# Patient Record
Sex: Female | Born: 1980 | Race: Black or African American | Hispanic: No | Marital: Single | Smoking: Current every day smoker
Health system: Southern US, Community
[De-identification: ages and names within clinical notes are randomized; demographics above are authoritative.]

## PROBLEM LIST (undated history)

## (undated) HISTORY — PX: HAND SURGERY: SHX662

---
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
## 2004-03-05 ENCOUNTER — Inpatient Hospital Stay (HOSPITAL_COMMUNITY): Admission: AD | Admit: 2004-03-05 | Discharge: 2004-03-05 | Payer: Self-pay | Admitting: Obstetrics and Gynecology

## 2004-03-05 ENCOUNTER — Emergency Department (HOSPITAL_COMMUNITY): Admission: EM | Admit: 2004-03-05 | Discharge: 2004-03-05 | Payer: Self-pay | Admitting: Family Medicine

## 2005-05-20 ENCOUNTER — Emergency Department (HOSPITAL_COMMUNITY): Admission: EM | Admit: 2005-05-20 | Discharge: 2005-05-20 | Payer: Self-pay | Admitting: Emergency Medicine

## 2005-05-30 ENCOUNTER — Emergency Department (HOSPITAL_COMMUNITY): Admission: EM | Admit: 2005-05-30 | Discharge: 2005-05-30 | Payer: Self-pay | Admitting: *Deleted

## 2005-07-09 ENCOUNTER — Emergency Department (HOSPITAL_COMMUNITY): Admission: EM | Admit: 2005-07-09 | Discharge: 2005-07-09 | Payer: Self-pay | Admitting: Family Medicine

## 2005-10-25 ENCOUNTER — Emergency Department (HOSPITAL_COMMUNITY): Admission: EM | Admit: 2005-10-25 | Discharge: 2005-10-25 | Payer: Self-pay | Admitting: Emergency Medicine

## 2006-07-03 ENCOUNTER — Emergency Department (HOSPITAL_COMMUNITY): Admission: EM | Admit: 2006-07-03 | Discharge: 2006-07-03 | Payer: Self-pay | Admitting: Emergency Medicine

## 2006-07-04 ENCOUNTER — Emergency Department (HOSPITAL_COMMUNITY): Admission: EM | Admit: 2006-07-04 | Discharge: 2006-07-04 | Payer: Self-pay | Admitting: Emergency Medicine

## 2008-02-01 ENCOUNTER — Emergency Department (HOSPITAL_COMMUNITY): Admission: EM | Admit: 2008-02-01 | Discharge: 2008-02-01 | Payer: Self-pay | Admitting: Emergency Medicine

## 2008-02-05 ENCOUNTER — Emergency Department (HOSPITAL_COMMUNITY): Admission: EM | Admit: 2008-02-05 | Discharge: 2008-02-05 | Payer: Self-pay | Admitting: Emergency Medicine

## 2008-03-28 ENCOUNTER — Emergency Department (HOSPITAL_COMMUNITY): Admission: EM | Admit: 2008-03-28 | Discharge: 2008-03-28 | Payer: Self-pay | Admitting: Emergency Medicine

## 2008-05-01 ENCOUNTER — Emergency Department (HOSPITAL_COMMUNITY): Admission: EM | Admit: 2008-05-01 | Discharge: 2008-05-01 | Payer: Self-pay | Admitting: Emergency Medicine

## 2008-07-29 ENCOUNTER — Emergency Department (HOSPITAL_COMMUNITY): Admission: EM | Admit: 2008-07-29 | Discharge: 2008-07-29 | Payer: Self-pay | Admitting: Emergency Medicine

## 2009-08-30 ENCOUNTER — Emergency Department (HOSPITAL_COMMUNITY): Admission: EM | Admit: 2009-08-30 | Discharge: 2009-08-30 | Payer: Self-pay | Admitting: Emergency Medicine

## 2009-09-03 ENCOUNTER — Emergency Department (HOSPITAL_COMMUNITY): Admission: EM | Admit: 2009-09-03 | Discharge: 2009-09-03 | Payer: Self-pay | Admitting: Emergency Medicine
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
## 2011-07-25 ENCOUNTER — Encounter: Payer: Self-pay | Admitting: Emergency Medicine

## 2011-07-25 ENCOUNTER — Emergency Department (HOSPITAL_COMMUNITY)
Admission: EM | Admit: 2011-07-25 | Discharge: 2011-07-25 | Discharge status: Against Medical Advice | Payer: Self-pay | Attending: Emergency Medicine | Admitting: Emergency Medicine

## 2011-07-25 DIAGNOSIS — Z0389 Encounter for observation for other suspected diseases and conditions ruled out: Secondary | ICD-10-CM | POA: Insufficient documentation

## 2011-07-25 NOTE — ED Notes (Signed)
Pt called x1. No answer at the time.

## 2011-07-25 NOTE — ED Notes (Signed)
PT. REPORTS VAGINAL ITCHING X2 DAYS , DENIES VAGINAL DRAINAGE ,  UNRELIEVED BY OTC MONISTAT.

## 2011-08-30 ENCOUNTER — Emergency Department (HOSPITAL_COMMUNITY)
Admission: EM | Admit: 2011-08-30 | Discharge: 2011-08-30 | Disposition: A | Discharge status: Home / Self Care | Payer: Self-pay | Attending: Emergency Medicine | Admitting: Emergency Medicine

## 2011-08-30 ENCOUNTER — Encounter (HOSPITAL_COMMUNITY): Payer: Self-pay | Admitting: Emergency Medicine

## 2011-08-30 DIAGNOSIS — J029 Acute pharyngitis, unspecified: Secondary | ICD-10-CM | POA: Insufficient documentation

## 2011-08-30 DIAGNOSIS — R599 Enlarged lymph nodes, unspecified: Secondary | ICD-10-CM | POA: Insufficient documentation

## 2011-08-30 DIAGNOSIS — H9209 Otalgia, unspecified ear: Secondary | ICD-10-CM | POA: Insufficient documentation

## 2011-08-30 DIAGNOSIS — F17200 Nicotine dependence, unspecified, uncomplicated: Secondary | ICD-10-CM | POA: Insufficient documentation

## 2011-08-30 MED ORDER — PENICILLIN V POTASSIUM 500 MG PO TABS: 500.0000 mg | ORAL_TABLET | Freq: Three times a day (TID) | ORAL | Status: AC

## 2011-08-30 MED ORDER — HYDROCODONE-ACETAMINOPHEN 5-325 MG PO TABS: 1.0000 | ORAL_TABLET | Freq: Four times a day (QID) | ORAL | Status: AC | PRN

## 2011-08-30 MED ORDER — ANTIPYRINE-BENZOCAINE 5.4-1.4 % OT SOLN
3.0000 [drp] | Freq: Once | OTIC | Status: AC
  Administered 2011-08-30: 4 [drp] via OTIC
  Filled 2011-08-30: qty 10

## 2011-08-30 MED ORDER — LIDOCAINE VISCOUS 2 % MT SOLN: 20.0000 mL | OROMUCOSAL | Status: AC | PRN

## 2011-08-30 MED ORDER — KETOROLAC TROMETHAMINE 60 MG/2ML IM SOLN
60.0000 mg | Freq: Once | INTRAMUSCULAR | Status: AC
  Administered 2011-08-30: 60 mg via INTRAMUSCULAR
  Filled 2011-08-30: qty 2

## 2011-08-30 NOTE — ED Provider Notes (Signed)
History     CSN: [id number]  Arrival date & time 08/30/11  1659   First MD Initiated Contact with Patient 08/30/11 1959     8:07 PM HPI Reports left side sore throat and ear pain for 2 weeks. States swollen lump in left anterior neck. Denies fever, cough. Reports painful to swallow is able to.  Patient is a 30 y.o. female presenting with pharyngitis. The history is provided by the patient.  Sore Throat This is a new problem. Episode onset: 2 weeks ago. The problem occurs constantly. The problem has been gradually worsening. Associated symptoms include neck pain and a sore throat. Pertinent negatives include no abdominal pain, chest pain, chills, congestion, coughing, fever, headaches, nausea or vomiting. The symptoms are aggravated by swallowing. She has tried acetaminophen and NSAIDs for the symptoms. The treatment provided no relief.    Past Medical History  Diagnosis Date  . Asthma     Past Surgical History  Procedure Date  . Hand surgery     No family history on file.  History  Substance Use Topics  . Smoking status: Current Everyday Smoker -- 0.5 packs/day    Types: Cigarettes  . Smokeless tobacco: Not on file  . Alcohol Use: Yes     occasionally    OB History    Grav Para Term Preterm Abortions TAB SAB Ect Mult Living                  Review of Systems  Constitutional: Negative for fever and chills.  HENT: Positive for ear pain, sore throat, trouble swallowing and neck pain. Negative for congestion, neck stiffness, voice change and sinus pressure.   Respiratory: Negative for cough, shortness of breath and wheezing.   Cardiovascular: Negative for chest pain.  Gastrointestinal: Negative for nausea, vomiting and abdominal pain.  Neurological: Negative for headaches.  All other systems reviewed and are negative.    Allergies  Review of patient's allergies indicates no known allergies.  Home Medications   Current Outpatient Rx  Name Route Sig Dispense  Refill  . ACETAMINOPHEN 500 MG PO TABS Oral Take 500 mg by mouth every 6 (six) hours as needed. pain     . ALBUTEROL SULFATE HFA 108 (90 BASE) MCG/ACT IN AERS Inhalation Inhale 2 puffs into the lungs every 6 (six) hours as needed. Shortness of breath.     . ESTER C PO Oral Take 1 tablet by mouth daily.        BP 143/87  Pulse 74  Temp(Src) 98.7 F (37.1 C) (Oral)  Resp 16  SpO2 100%  LMP 08/08/2011  Physical Exam  Vitals reviewed. Constitutional: She is oriented to person, place, and time. Vital signs are normal. She appears well-developed and well-nourished. No distress.  HENT:  Head: Normocephalic and atraumatic. No trismus in the jaw.  Right Ear: Tympanic membrane, external ear and ear canal normal.  Left Ear: Tympanic membrane, external ear and ear canal normal.  Nose: Nose normal.  Mouth/Throat: Uvula is midline and mucous membranes are normal. Posterior oropharyngeal erythema present. No oropharyngeal exudate, posterior oropharyngeal edema or tonsillar abscesses.    Eyes: Pupils are equal, round, and reactive to light.  Neck: Neck supple.  Pulmonary/Chest: Effort normal. She has no wheezes. She has no rales. She exhibits no tenderness.  Lymphadenopathy:       Head (left side): Submandibular (tender) adenopathy present.  Neurological: She is alert and oriented to person, place, and time.  Skin: Skin is warm and dry.  No rash noted. No erythema. No pallor.  Psychiatric: She has a normal mood and affect. Her behavior is normal.    ED Course  Procedures   MDM          Thomasene Lot, [place] 08/30/11 2019

## 2011-08-30 NOTE — ED Notes (Signed)
Pt c/o throat pain and swelling and ear pain beginning again yesterday; cold s/s x 2 week; reports that appx 2 weeks ago throat was swollen and sore but went away, returning yesterday; denies difficulty breathing/swallowing at triage, no stridor present, respirations unlabored

## 2011-08-31 NOTE — ED Provider Notes (Signed)
Medical screening examination/treatment/procedure(s) were performed by non-physician practitioner and as supervising physician I was immediately available for consultation/collaboration.   Glorianne Proctor, MD 08/31/11 0103 

## 2012-09-11 ENCOUNTER — Encounter (HOSPITAL_COMMUNITY): Payer: Self-pay

## 2012-09-11 ENCOUNTER — Emergency Department (HOSPITAL_COMMUNITY)
Admission: EM | Admit: 2012-09-11 | Discharge: 2012-09-11 | Disposition: A | Discharge status: Home / Self Care | Payer: Self-pay | Attending: Emergency Medicine | Admitting: Emergency Medicine

## 2012-09-11 DIAGNOSIS — F17200 Nicotine dependence, unspecified, uncomplicated: Secondary | ICD-10-CM | POA: Insufficient documentation

## 2012-09-11 DIAGNOSIS — R21 Rash and other nonspecific skin eruption: Secondary | ICD-10-CM | POA: Insufficient documentation

## 2012-09-11 DIAGNOSIS — B86 Scabies: Secondary | ICD-10-CM | POA: Insufficient documentation

## 2012-09-11 DIAGNOSIS — Z79899 Other long term (current) drug therapy: Secondary | ICD-10-CM | POA: Insufficient documentation

## 2012-09-11 DIAGNOSIS — J45909 Unspecified asthma, uncomplicated: Secondary | ICD-10-CM | POA: Insufficient documentation

## 2012-09-11 DIAGNOSIS — L089 Local infection of the skin and subcutaneous tissue, unspecified: Secondary | ICD-10-CM | POA: Insufficient documentation

## 2012-09-11 MED ORDER — PERMETHRIN 5 % EX CREA: TOPICAL_CREAM | CUTANEOUS | Status: DC

## 2012-09-11 MED ORDER — SULFAMETHOXAZOLE-TRIMETHOPRIM 800-160 MG PO TABS: 1.0000 | ORAL_TABLET | Freq: Two times a day (BID) | ORAL | Status: DC

## 2012-09-11 NOTE — ED Provider Notes (Signed)
History     CSN: [id number]  Arrival date & time 09/11/12  1015   First MD Initiated Contact with Patient 09/11/12 1034      Chief Complaint  Patient presents with  . Pruritis  . Rash    (Consider location/radiation/quality/duration/timing/severity/associated sxs/prior treatment) HPI Comments: Patient is a 32 year old female who presents with a 2 week history of rash. The rash started gradually and progressively worsened since the onset. The rash is located on her entire body. Patient has tried nothing without relief. Patient denies new exposures to medications, soaps, lotions, detergent. Patient reports associated itching. Patient's sister and her sister's boyfriend have been recently diagnosed with scabies and she has been around them recently. No aggravating/alleviating factors. Patient denies fever, chills, NVD, sore throat, oral lesions, ocular involvement, throat closing, wheezing, SOB, chest pain, abdominal pain.      Past Medical History  Diagnosis Date  . Asthma     Past Surgical History  Procedure Date  . Hand surgery     No family history on file.  History  Substance Use Topics  . Smoking status: Current Every Day Smoker -- 0.5 packs/day    Types: Cigarettes  . Smokeless tobacco: Not on file  . Alcohol Use: Yes     Comment: occasionally    OB History    Grav Para Term Preterm Abortions TAB SAB Ect Mult Living                  Review of Systems  Skin: Positive for rash.  All other systems reviewed and are negative.    Allergies  Review of patient's allergies indicates no known allergies.  Home Medications   Current Outpatient Rx  Name  Route  Sig  Dispense  Refill  . ALBUTEROL SULFATE HFA 108 (90 BASE) MCG/ACT IN AERS   Inhalation   Inhale 2 puffs into the lungs every 6 (six) hours as needed. For shortness of breath           BP 138/88  Pulse 101  Temp 98.6 F (37 C) (Oral)  SpO2 100%  Physical Exam  Nursing note and vitals  reviewed. Constitutional: She appears well-developed and well-nourished. No distress.  HENT:  Head: Normocephalic and atraumatic.  Eyes: Conjunctivae normal are normal.  Cardiovascular: Normal rate and regular rhythm.  Exam reveals no gallop and no friction rub.   No murmur heard. Pulmonary/Chest: Effort normal and breath sounds normal. She has no wheezes. She has no rales. She exhibits no tenderness.  Abdominal: Soft. There is no tenderness.  Musculoskeletal: Normal range of motion.  Neurological: She is alert.       Speech is goal-oriented. Moves limbs without ataxia.   Skin: Skin is warm and dry.       Small scattered pustules located on bilateral wrists, ankles, torso and back, as well as buttocks.   Psychiatric: She has a normal mood and affect. Her behavior is normal.    ED Course  Procedures (including critical care time)  Labs Reviewed - No data to display No results found.   1. Scabies   2. Skin infection       MDM  11:01 AM Patient will have Permethrin for scabies. Patient also has a skin infection so I will prescribe Bactrim as well. Vitals stable for discharge. No further evaluation needed at this time.        Emilia Beck, PA-C 09/11/12 1537

## 2012-09-11 NOTE — ED Notes (Signed)
Pt has pruritis to bilateral knees, elbows, wrists, stomach, lower back, and mid chest. A&Ox4, ambulatory, nad.

## 2012-09-11 NOTE — ED Notes (Signed)
Onset 2 weeks ago developed itching took OTC medication no relief and states sister's boyfriend and sister tested for scabies. Patient states rash boils knees elbows thighs and back.

## 2012-09-11 NOTE — ED Notes (Signed)
Triage completed by Greg RN  

## 2012-09-11 NOTE — ED Provider Notes (Signed)
Medical screening examination/treatment/procedure(s) were performed by non-physician practitioner and as supervising physician I was immediately available for consultation/collaboration.   David H Yao, MD 09/11/12 1545 

## 2013-06-16 ENCOUNTER — Encounter (HOSPITAL_COMMUNITY): Payer: Self-pay | Admitting: Emergency Medicine

## 2013-06-16 ENCOUNTER — Emergency Department (HOSPITAL_COMMUNITY)
Admission: EM | Admit: 2013-06-16 | Discharge: 2013-06-16 | Discharge status: Against Medical Advice | Payer: Self-pay | Attending: Emergency Medicine | Admitting: Emergency Medicine

## 2013-06-16 DIAGNOSIS — R300 Dysuria: Secondary | ICD-10-CM | POA: Insufficient documentation

## 2013-06-16 NOTE — ED Notes (Signed)
Pt called x1 for room w/no answer

## 2013-06-16 NOTE — ED Notes (Signed)
Pt reporting dysuria and back pain for 1 week. Denies hematuria. Reports vaginal discharge, yellow with some odor. Denies fevers or chills. Pt is a x 4.

## 2013-06-16 NOTE — ED Notes (Signed)
Pt called x2 for room placement w/no answer

## 2013-06-16 NOTE — ED Notes (Signed)
Pt did not answer x 1 

## 2014-12-26 ENCOUNTER — Encounter (HOSPITAL_COMMUNITY): Payer: Self-pay | Admitting: Nurse Practitioner

## 2014-12-26 ENCOUNTER — Emergency Department (HOSPITAL_COMMUNITY): Payer: Self-pay

## 2014-12-26 ENCOUNTER — Emergency Department (HOSPITAL_COMMUNITY)
Admission: EM | Admit: 2014-12-26 | Discharge: 2014-12-26 | Disposition: A | Discharge status: Home / Self Care | Payer: Self-pay | Attending: Emergency Medicine | Admitting: Emergency Medicine

## 2014-12-26 DIAGNOSIS — S0990XA Unspecified injury of head, initial encounter: Secondary | ICD-10-CM | POA: Insufficient documentation

## 2014-12-26 DIAGNOSIS — Z720 Tobacco use: Secondary | ICD-10-CM | POA: Insufficient documentation

## 2014-12-26 DIAGNOSIS — S01511A Laceration without foreign body of lip, initial encounter: Secondary | ICD-10-CM | POA: Insufficient documentation

## 2014-12-26 DIAGNOSIS — Z23 Encounter for immunization: Secondary | ICD-10-CM | POA: Insufficient documentation

## 2014-12-26 DIAGNOSIS — Y9289 Other specified places as the place of occurrence of the external cause: Secondary | ICD-10-CM | POA: Insufficient documentation

## 2014-12-26 DIAGNOSIS — Z79899 Other long term (current) drug therapy: Secondary | ICD-10-CM | POA: Insufficient documentation

## 2014-12-26 DIAGNOSIS — S00432A Contusion of left ear, initial encounter: Secondary | ICD-10-CM

## 2014-12-26 DIAGNOSIS — J45909 Unspecified asthma, uncomplicated: Secondary | ICD-10-CM | POA: Insufficient documentation

## 2014-12-26 DIAGNOSIS — Y998 Other external cause status: Secondary | ICD-10-CM | POA: Insufficient documentation

## 2014-12-26 DIAGNOSIS — Y9389 Activity, other specified: Secondary | ICD-10-CM | POA: Insufficient documentation

## 2014-12-26 MED ORDER — NAPROXEN 500 MG PO TABS: 500.0000 mg | ORAL_TABLET | Freq: Two times a day (BID) | ORAL | Status: DC

## 2014-12-26 MED ORDER — TETANUS-DIPHTH-ACELL PERTUSSIS 5-2.5-18.5 LF-MCG/0.5 IM SUSP
0.5000 mL | Freq: Once | INTRAMUSCULAR | Status: AC
  Administered 2014-12-26: 0.5 mL via INTRAMUSCULAR
  Filled 2014-12-26: qty 0.5

## 2014-12-26 NOTE — Discharge Instructions (Signed)
Ice your lip and your ear several times a day. If continue to have problems with hearing, follow up with ENT specialist as referred.   Head Injury You have received a head injury. It does not appear serious at this time. Headaches and vomiting are common following head injury. It should be easy to awaken from sleeping. Sometimes it is necessary for you to stay in the emergency department for a while for observation. Sometimes admission to the hospital may be needed. After injuries such as yours, most problems occur within the first 24 hours, but side effects may occur up to 7-10 days after the injury. It is important for you to carefully monitor your condition and contact your health care provider or seek immediate medical care if there is a change in your condition. WHAT ARE THE TYPES OF HEAD INJURIES? Head injuries can be as minor as a bump. Some head injuries can be more severe. More severe head injuries include:  A jarring injury to the brain (concussion).  A bruise of the brain (contusion). This mean there is bleeding in the brain that can cause swelling.  A cracked skull (skull fracture).  Bleeding in the brain that collects, clots, and forms a bump (hematoma). WHAT CAUSES A HEAD INJURY? A serious head injury is most likely to happen to someone who is in a car wreck and is not wearing a seat belt. Other causes of major head injuries include bicycle or motorcycle accidents, sports injuries, and falls. HOW ARE HEAD INJURIES DIAGNOSED? A complete history of the event leading to the injury and your current symptoms will be helpful in diagnosing head injuries. Many times, pictures of the brain, such as CT or MRI are needed to see the extent of the injury. Often, an overnight hospital stay is necessary for observation.  WHEN SHOULD I SEEK IMMEDIATE MEDICAL CARE?  You should get help right away if:  You have confusion or drowsiness.  You feel sick to your stomach (nauseous) or have continued,  forceful vomiting.  You have dizziness or unsteadiness that is getting worse.  You have severe, continued headaches not relieved by medicine. Only take over-the-counter or prescription medicines for pain, fever, or discomfort as directed by your health care provider.  You do not have normal function of the arms or legs or are unable to walk.  You notice changes in the black spots in the center of the colored part of your eye (pupil).  You have a clear or bloody fluid coming from your nose or ears.  You have a loss of vision. During the next 24 hours after the injury, you must stay with someone who can watch you for the warning signs. This person should contact local emergency services (911 in the [place] if you have seizures, you become unconscious, or you are unable to wake up. HOW CAN I PREVENT A HEAD INJURY IN THE FUTURE? The most important factor for preventing major head injuries is avoiding motor vehicle accidents. To minimize the potential for damage to your head, it is crucial to wear seat belts while riding in motor vehicles. Wearing helmets while bike riding and playing collision sports (like football) is also helpful. Also, avoiding dangerous activities around the house will further help reduce your risk of head injury.  WHEN CAN I RETURN TO NORMAL ACTIVITIES AND ATHLETICS? You should be reevaluated by your health care provider before returning to these activities. If you have any of the following symptoms, you should not return to activities  or contact sports until 1 week after the symptoms have stopped:  Persistent headache.  Dizziness or vertigo.  Poor attention and concentration.  Confusion.  Memory problems.  Nausea or vomiting.  Fatigue or tire easily.  Irritability.  Intolerant of bright lights or loud noises.  Anxiety or depression.  Disturbed sleep. MAKE SURE YOU:   Understand these instructions.  Will watch your condition.  Will get help right away if  you are not doing well or get worse. Document Released: 08/20/2005 Document Revised: 08/25/2013 Document Reviewed: 04/27/2013 [facility] Patient Information 2015 [place], [place]. This information is not intended to replace advice given to you by your health care provider. Make sure you discuss any questions you have with your health care provider.

## 2014-12-26 NOTE — ED Notes (Signed)
Patient transported to CT 
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
## 2014-12-26 NOTE — ED Notes (Signed)
Pt standing at nurses station wanting to leave, pt given D/C instructions and left
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
## 2018-06-09 ENCOUNTER — Other Ambulatory Visit: Payer: Self-pay

## 2018-06-09 ENCOUNTER — Emergency Department (HOSPITAL_COMMUNITY)
Admission: EM | Admit: 2018-06-09 | Discharge: 2018-06-09 | Disposition: A | Discharge status: Home / Self Care | Payer: Self-pay | Attending: Emergency Medicine | Admitting: Emergency Medicine

## 2018-06-09 ENCOUNTER — Encounter (HOSPITAL_COMMUNITY): Payer: Self-pay | Admitting: Emergency Medicine

## 2018-06-09 DIAGNOSIS — H9203 Otalgia, bilateral: Secondary | ICD-10-CM | POA: Insufficient documentation

## 2018-06-09 DIAGNOSIS — J029 Acute pharyngitis, unspecified: Secondary | ICD-10-CM | POA: Insufficient documentation

## 2018-06-09 DIAGNOSIS — J45909 Unspecified asthma, uncomplicated: Secondary | ICD-10-CM | POA: Insufficient documentation

## 2018-06-09 DIAGNOSIS — Z79899 Other long term (current) drug therapy: Secondary | ICD-10-CM | POA: Insufficient documentation

## 2018-06-09 DIAGNOSIS — F17210 Nicotine dependence, cigarettes, uncomplicated: Secondary | ICD-10-CM | POA: Insufficient documentation

## 2018-06-09 MED ORDER — AZITHROMYCIN 250 MG PO TABS: 250.0000 mg | ORAL_TABLET | Freq: Once | ORAL | 0 refills | Status: AC

## 2018-06-09 MED ORDER — NEOMYCIN-POLYMYXIN-HC 3.5-10000-1 OT SUSP: 4.0000 [drp] | Freq: Three times a day (TID) | OTIC | 0 refills | Status: AC

## 2018-06-09 NOTE — Discharge Instructions (Signed)
You were evaluated in the emergency department for earache and sore throat.  We are prescribing some antibiotics and some eardrops.  Please continue warm salt water gargles and Tylenol and ibuprofen for pain.  Keep well-hydrated.  Return if any worsening symptoms.

## 2018-06-09 NOTE — ED Triage Notes (Signed)
States history of "swimmers ear" every year and developed bilateral ear pain 2 days ago with a sore throat. Airway intact bilateral equal chest rise and fall.

## 2018-06-09 NOTE — ED Notes (Signed)
Signature pad not working verbalized understanding of discharge instructions.  

## 2018-06-09 NOTE — ED Provider Notes (Signed)
[facility] EMERGENCY DEPARTMENT Provider Note   CSN: [id number] Arrival date & time: 06/09/18  0865     History   Chief Complaint Chief Complaint  Patient presents with  . Ear Fullness  . Sore Throat    HPI Ashley Barber is a 37 y.o. female.  She is complaining of a few days of bilateral ear pain and throat pain.  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  She has a history of getting swimmer's ear in the past although she does not particularly swim much.  No fevers no chills.  Says it hurts more when she swallows.  She is tried nothing for it.  She is a smoker.   Ear Fullness  This is a new problem. The current episode started 2 days ago. The problem occurs constantly. The problem has not changed since onset.Pertinent negatives include no chest pain, no abdominal pain, no headaches and no shortness of breath. The symptoms are aggravated by swallowing. Nothing relieves the symptoms. She has tried nothing for the symptoms. The treatment provided no relief.  Sore Throat  The current episode started 2 days ago. The problem occurs constantly. The problem has not changed since onset.Pertinent negatives include no chest pain, no abdominal pain, no headaches and no shortness of breath. The symptoms are aggravated by swallowing. Nothing relieves the symptoms. She has tried nothing for the symptoms. The treatment provided no relief.    Past Medical History:  Diagnosis Date  . Asthma     There are no active problems to display for this patient.   Past Surgical History:  Procedure Laterality Date  . HAND SURGERY       OB History   None      Home Medications    Prior to Admission medications   Medication Sig Start Date End Date Taking? Authorizing Provider  naproxen (NAPROSYN) 500 MG tablet Take 1 tablet (500 mg total) by mouth 2 (two) times daily. 12/26/14   Jeannett Senior, PA-C    Family History No  family history on file.  Social History Social History   Tobacco Use  . Smoking status: Current Every Day Smoker    Packs/day: 0.50    Types: Cigarettes  Substance Use Topics  . Alcohol use: Yes    Comment: occasionally  . Drug use: No     Allergies   Patient has no known allergies.   Review of Systems Review of Systems  Constitutional: Negative for fever.  HENT: Positive for ear pain and sore throat. Negative for ear discharge.   Eyes: Negative for visual disturbance.  Respiratory: Negative for shortness of breath.   Cardiovascular: Negative for chest pain.  Gastrointestinal: Negative for abdominal pain.  Genitourinary: Negative for dysuria.  Musculoskeletal: Negative for neck pain.  Skin: Negative for rash.  Neurological: Negative for headaches.     Physical Exam Updated Vital Signs BP (!) 118/97   Pulse (!) 108   Temp 98.9 F (37.2 C) (Oral)   Resp 16   Ht 5\' 3"  (1.6 m)   Wt 46.7 kg   SpO2 98%   BMI 18.25 kg/m   Physical Exam  Constitutional: She appears well-developed. No distress.  HENT:  Head: Normocephalic and atraumatic.  Mouth/Throat: Uvula is midline and oropharynx is clear and moist. No uvula swelling. No tonsillar exudate.  Patient's ears canals do not have significant amount of narrowing.  There is a little bit of debris in  there but that could just be wax.  She does have some erythema in her posterior pharynx and multiple superficial shallow ulcers over her soft palate and pharyngeal pillars.  Eyes: Conjunctivae are normal.  Neck: Neck supple.  Cardiovascular: Normal rate, regular rhythm and intact distal pulses.  No murmur heard. Pulmonary/Chest: Effort normal and breath sounds normal. No stridor. No respiratory distress. She has no wheezes.  Abdominal: Soft. There is no tenderness.  Musculoskeletal: Normal range of motion. She exhibits no edema or tenderness.  Lymphadenopathy:    She has cervical adenopathy.  Neurological: She is alert.  GCS eye subscore is 4. GCS verbal subscore is 5. GCS motor subscore is 6.  Skin: Skin is warm and dry.  Psychiatric: She has a normal mood and affect.  Nursing note and vitals reviewed.    ED Treatments / Results  Labs (all labs ordered are listed, but only abnormal results are displayed) Labs Reviewed - No data to display  EKG None  Radiology No results found.  Procedures Procedures (including critical care time)  Medications Ordered in ED Medications - No data to display   Initial Impression / Assessment and Plan / ED Course  I have reviewed the triage vital signs and the nursing notes.  Pertinent labs & imaging results that were available during my care of the patient were reviewed by me and considered in my medical decision making (see chart for details).    Patient with likely viral upper respiratory infection with a shallow ulcerations in her pharynx and her blocked ear sensation.  She states she has a history of swimmer's ear and there is some debris in the canals but it does not seem that significant.  We will try her later on some eardrops and a course of antibiotics for the pharynx.  She denies any HIV or chronic disease and no high risk factors.  Recommend that she follow-up back here if she is not improving in a few days.  Final Clinical Impressions(s) / ED Diagnoses   Final diagnoses:  Pharyngitis, unspecified etiology  Otalgia of both ears    ED Discharge Orders         Ordered    neomycin-polymyxin-hydrocortisone (CORTISPORIN) 3.5-10000-1 OTIC suspension  3 times daily     06/09/18 1016    azithromycin (ZITHROMAX Z-PAK) 250 MG tablet   Once     06/09/18 1016           Hayden Rasmussen, MD 06/10/18 1440

## 2019-05-24 ENCOUNTER — Other Ambulatory Visit: Payer: Self-pay

## 2019-05-24 ENCOUNTER — Emergency Department (HOSPITAL_COMMUNITY)
Admission: EM | Admit: 2019-05-24 | Discharge: 2019-05-24 | Disposition: A | Discharge status: Home / Self Care | Payer: Self-pay | Attending: Emergency Medicine | Admitting: Emergency Medicine

## 2019-05-24 DIAGNOSIS — L709 Acne, unspecified: Secondary | ICD-10-CM | POA: Insufficient documentation

## 2019-05-24 DIAGNOSIS — F17210 Nicotine dependence, cigarettes, uncomplicated: Secondary | ICD-10-CM | POA: Insufficient documentation

## 2019-05-24 DIAGNOSIS — R21 Rash and other nonspecific skin eruption: Secondary | ICD-10-CM | POA: Insufficient documentation

## 2019-05-24 DIAGNOSIS — J45909 Unspecified asthma, uncomplicated: Secondary | ICD-10-CM | POA: Insufficient documentation

## 2019-05-24 MED ORDER — CLINDAMYCIN PHOSPHATE 1 % EX SOLN: Freq: Two times a day (BID) | CUTANEOUS | 0 refills | Status: DC

## 2019-05-24 NOTE — ED Notes (Signed)
Patient verbalizes understanding of discharge instructions. Opportunity for questioning and answers were provided. Armband removed by staff, pt discharged from ED.  

## 2019-05-24 NOTE — ED Provider Notes (Signed)
[facility] EMERGENCY DEPARTMENT Provider Note   CSN: [id number] Arrival date & time: 05/24/19  1451     History   Chief Complaint No chief complaint on file.   HPI Ashley Barber is a 38 y.o. female presents for evaluation of acute onset, progressively worsening rash to the face for 1 week.  She reports that symptoms began with one pimple that she "popped "and since then has had several other lesions along her cheeks and nose.  Has not tried anything for symptoms.  Denies any fevers, nausea, vomiting.  No facial swelling, no drooling, throat tightness, swelling of lips or tongue.  Denies any new soaps, detergents, shampoos, or lotions.  No new exposures to any medications.  Has not tried anything for her symptoms.     The history is provided by the patient.    Past Medical History:  Diagnosis Date  . Asthma     There are no active problems to display for this patient.   Past Surgical History:  Procedure Laterality Date  . HAND SURGERY       OB History   No obstetric history on file.      Home Medications    Prior to Admission medications   Medication Sig Start Date End Date Taking? Authorizing Provider  clindamycin (CLEOCIN T) 1 % external solution Apply topically 2 (two) times daily. 05/24/19   Kelton Bultman A, PA-C  naproxen (NAPROSYN) 500 MG tablet Take 1 tablet (500 mg total) by mouth 2 (two) times daily. 12/26/14   Jeannett Senior, PA-C    Family History No family history on file.  Social History Social History   Tobacco Use  . Smoking status: Current Every Day Smoker    Packs/day: 0.50    Types: Cigarettes  Substance Use Topics  . Alcohol use: Yes    Comment: occasionally  . Drug use: No     Allergies   Patient has no known allergies.   Review of Systems Review of Systems  Constitutional: Negative for chills and fever.  HENT: Negative for facial swelling and trouble swallowing.   Respiratory: Negative for shortness of  breath.   Cardiovascular: Negative for chest pain.  Gastrointestinal: Negative for abdominal pain, nausea and vomiting.  Skin: Positive for rash.  All other systems reviewed and are negative.    Physical Exam Updated Vital Signs BP 127/80 (BP Location: Right Arm)   Pulse (!) 105   Temp 99.7 F (37.6 C) (Oral)   Resp 17   SpO2 100%   Physical Exam Vitals signs and nursing note reviewed.  Constitutional:      General: She is not in acute distress.    Appearance: She is well-developed.  HENT:     Head: Normocephalic and atraumatic.     Mouth/Throat:     Comments: No swelling of the lips or tongue.  Tolerating secretions without difficulty.  No uvular edema. Eyes:     General:        Right eye: No discharge.        Left eye: No discharge.     Conjunctiva/sclera: Conjunctivae normal.  Neck:     Vascular: No JVD.     Trachea: No tracheal deviation.  Cardiovascular:     Rate and Rhythm: Normal rate and regular rhythm.  Pulmonary:     Effort: Pulmonary effort is normal.     Breath sounds: Normal breath sounds.  Abdominal:     General: There is no distension.  Skin:  General: Skin is warm and dry.     Findings: Rash present.     Comments: See below image.  Patient with multiple raised nodular lesions to the face primarily over the cheeks and nose.  Neurological:     Mental Status: She is alert.  Psychiatric:        Behavior: Behavior normal.        ED Treatments / Results  Labs (all labs ordered are listed, but only abnormal results are displayed) Labs Reviewed - No data to display  EKG None  Radiology No results found.  Procedures Procedures (including critical care time)  Medications Ordered in ED Medications - No data to display   Initial Impression / Assessment and Plan / ED Course  I have reviewed the triage vital signs and the nursing notes.  Pertinent labs & imaging results that were available during my care of the patient were reviewed by  me and considered in my medical decision making (see chart for details).         Patient presenting with rash consistent with acne.  She is afebrile, initially mildly tachycardic on triage but is not tachycardic on my assessment.  Nontoxic in appearance.  No evidence of anaphylactic reaction or urticarial rash.  No respiratory distress.  Tolerating secretions without difficulty.  Will treat with topical clindamycin, recommend over-the-counter benzyl peroxide and salicylic acid washes.  Discussed cleaning her masks frequently.  Recommend follow-up with PCP or dermatology for reevaluation of symptoms.  Discussed strict ED return precautions. Pt verbalized understanding of and agreement with plan and is safe for discharge home at this time.   Final Clinical Impressions(s) / ED Diagnoses   Final diagnoses:  Facial rash    ED Discharge Orders         Ordered    clindamycin (CLEOCIN T) 1 % external solution  2 times daily     05/24/19 [id number], PA-C 05/24/19 1650    Tegeler, Gwenyth Allegra, MD 05/25/19 0003

## 2019-05-24 NOTE — Discharge Instructions (Addendum)
I recommend that you use a daily cleanser  You might try an over the counter cleanser that has benzoyl peroxide.  I recommend that you start with a product that has 2.5% benzoyl peroxide.  Stronger concentrations have not been shown to be more effective.  I have prescribed a topical gel with an antibiotic:  Clindamycin 1% lotion.  Apply the lotion to the affected skin twice daily.  Be sure to read the package insert to understand potential side effects,    If excessive dryness or peeling occurs, reduce dose frequency or concentration of the topical scrubs.  If excessive stinging or burning occurs, remove the topical gel with mild soap and water and resume at a lower dose the next day.  Remember oral antibiotics and topical acne treatments may increase your sensitivity to the sun!  HOME CARE: Do not squeeze pimples because that can often lead to infections, worse acne, and scars. Use a moisturizer that contains retinoid or fruit acids that may inhibit the development of new acne lesions. Although there is not a clear link that foods can cause acne, doctors do believe that too many sweets predispose you to skin problems.  GET HELP RIGHT AWAY IF: If your acne gets worse or is not better within 10 days. If you become depressed. If you become pregnant, discontinue medications and call your OB/GYN.

## 2019-05-24 NOTE — ED Triage Notes (Signed)
Pt sts she had a pimple last week that she popped and then scratched for several days. Over the last few days more blemishes have appeared that are painful and itchy.

## 2019-07-02 ENCOUNTER — Emergency Department (HOSPITAL_COMMUNITY)
Admission: EM | Admit: 2019-07-02 | Discharge: 2019-07-02 | Disposition: A | Discharge status: Home / Self Care | Payer: Self-pay | Attending: Emergency Medicine | Admitting: Emergency Medicine

## 2019-07-02 DIAGNOSIS — F17210 Nicotine dependence, cigarettes, uncomplicated: Secondary | ICD-10-CM | POA: Insufficient documentation

## 2019-07-02 DIAGNOSIS — J45909 Unspecified asthma, uncomplicated: Secondary | ICD-10-CM | POA: Insufficient documentation

## 2019-07-02 DIAGNOSIS — Z79899 Other long term (current) drug therapy: Secondary | ICD-10-CM | POA: Insufficient documentation

## 2019-07-02 DIAGNOSIS — H1032 Unspecified acute conjunctivitis, left eye: Secondary | ICD-10-CM | POA: Insufficient documentation

## 2019-07-02 MED ORDER — TETRACAINE HCL 0.5 % OP SOLN
2.0000 [drp] | Freq: Once | OPHTHALMIC | Status: AC
  Administered 2019-07-02: 2 [drp] via OPHTHALMIC
  Filled 2019-07-02: qty 4

## 2019-07-02 MED ORDER — FLUORESCEIN SODIUM 1 MG OP STRP
1.0000 | ORAL_STRIP | Freq: Once | OPHTHALMIC | Status: AC
  Administered 2019-07-02: 1 via OPHTHALMIC
  Filled 2019-07-02: qty 1

## 2019-07-02 MED ORDER — ERYTHROMYCIN 5 MG/GM OP OINT
1.0000 "application " | TOPICAL_OINTMENT | Freq: Once | OPHTHALMIC | Status: AC
  Administered 2019-07-02: 1 via OPHTHALMIC
  Filled 2019-07-02: qty 3.5

## 2019-07-02 NOTE — Discharge Instructions (Signed)
Please read instructions below. It is important you wash your hands very frequently as this is contagious and can travel to your other eye or to other people. Apply a warm compress to your right eye multiple times per day. Apply 1/2 inch ribbon of antibiotic ointment to your eye 4 times daily for 5 days. Schedule an appointment with the eye doctor to follow-up and ensure proper healing. Return to the emergency department if you develop thick yellow/greenish discharge, loss of vision, or new or worsening symptoms.

## 2019-07-02 NOTE — ED Provider Notes (Signed)
[facility]ERGENCY DEPARTMENT Provider Note   CSN: [id number] Arrival date & time: 07/02/19  1415     History   Chief Complaint Chief Complaint  Patient presents with  . Eye Pain    HPI Ashley Barber is a 38 y.o. female presenting to the emergency department with gradual onset of left eye irritation that began last night.  Patient states initially she felt near foreign body sensation and then she had progressively worsening discomfort.  Her eye is red with clear drainage, however she woke up this morning with purulent drainage in her eye was sealed shut.  She works at [employer] and has had no known foreign body in her eye.  She wears glasses however is not a contact lens wearer.  No headache.  Slightly blurry vision due to the drainage.     The history is provided by the patient.    Past Medical History:  Diagnosis Date  . Asthma     There are no active problems to display for this patient.   Past Surgical History:  Procedure Laterality Date  . HAND SURGERY       OB History   No obstetric history on file.      Home Medications    Prior to Admission medications   Medication Sig Start Date End Date Taking? Authorizing Provider  clindamycin (CLEOCIN T) 1 % external solution Apply topically 2 (two) times daily. 05/24/19   Fawze, Mina A, PA-C  naproxen (NAPROSYN) 500 MG tablet Take 1 tablet (500 mg total) by mouth 2 (two) times daily. 12/26/14   Jeannett Senior, PA-C    Family History No family history on file.  Social History Social History   Tobacco Use  . Smoking status: Current Every Day Smoker    Packs/day: 0.50    Types: Cigarettes  Substance Use Topics  . Alcohol use: Yes    Comment: occasionally  . Drug use: No     Allergies   Patient has no known allergies.   Review of Systems Review of Systems  Constitutional: Negative for fever.  HENT: Positive for ear discharge and ear pain.   Neurological: Negative for  headaches.     Physical Exam Updated Vital Signs BP (!) 139/93 (BP Location: Right Arm)   Pulse 85   Temp 98.3 F (36.8 C) (Oral)   Resp 18   LMP 06/30/2019   SpO2 100%   Physical Exam Vitals signs and nursing note reviewed.  Constitutional:      Appearance: She is well-developed.  HENT:     Head: Normocephalic and atraumatic.  Eyes:     Extraocular Movements: Extraocular movements intact.     Pupils: Pupils are equal, round, and reactive to light.     Comments: Left upper lip with slight edema.  Left eye conjunctival injection with mild chemosis present.  There is active clear discharge present.  L eye visualized under Woods lamp with fluorescein stain; no uptake noted.  No dendritic staining.  No obvious foreign bodies. No consensual photophobia.  Pulmonary:     Effort: Pulmonary effort is normal.  Neurological:     Mental Status: She is alert.  Psychiatric:        Mood and Affect: Mood normal.        Behavior: Behavior normal.      ED Treatments / Results  Labs (all labs ordered are listed, but only abnormal results are displayed) Labs Reviewed - No data to display  EKG  None  Radiology No results found.  Procedures Procedures (including critical care time)  Medications Ordered in ED Medications  fluorescein ophthalmic strip 1 strip (1 strip Left Eye Given 07/02/19 1648)  tetracaine (PONTOCAINE) 0.5 % ophthalmic solution 2 drop (2 drops Left Eye Given 07/02/19 1648)  erythromycin ophthalmic ointment 1 application (1 application Left Eye Given 07/02/19 1733)     Initial Impression / Assessment and Plan / ED Course  I have reviewed the triage vital signs and the nursing notes.  Pertinent labs & imaging results that were available during my care of the patient were reviewed by me and considered in my medical decision making (see chart for details).       DANYEAL GAMEL presents with symptoms consistent with bacterial conjunctivitis.  Purulent  discharge reported.  No corneal abrasions, entrapment, consensual photophobia, or dendritic staining with fluorescein study.  Presentation non-concerning for  corneal abrasions or HSV. No evidence of preseptal or orbital cellulitis.  Pt is  not a contact lens wearer.  Patient will be given erythromycin ophthalmic.  Personal hygiene and frequent handwashing discussed.  Patient advised to followup with ophthalmologist for reevaluation in several days..  Patient verbalizes understanding and is agreeable with discharge.  Discussed results, findings, treatment and follow up. Patient advised of return precautions. Patient verbalized understanding and agreed with plan.  Final Clinical Impressions(s) / ED Diagnoses   Final diagnoses:  Acute bacterial conjunctivitis of left eye    ED Discharge Orders    None       Darlyn Repsher, [place] N, PA-C 07/02/19 1749    Quintella Reichert, MD 07/03/19 [phone]

## 2019-07-02 NOTE — ED Triage Notes (Signed)
Patient to ER for evaluation of left eye pain, drainage, and redness. Pain with light. Draining clear drainage. She does report this morning it was mucus.

## 2019-09-22 ENCOUNTER — Encounter (HOSPITAL_COMMUNITY): Payer: Self-pay | Admitting: Emergency Medicine

## 2019-09-22 ENCOUNTER — Emergency Department (HOSPITAL_COMMUNITY)
Admission: EM | Admit: 2019-09-22 | Discharge: 2019-09-22 | Disposition: A | Discharge status: Home / Self Care | Payer: Self-pay | Attending: Emergency Medicine | Admitting: Emergency Medicine

## 2019-09-22 ENCOUNTER — Other Ambulatory Visit: Payer: Self-pay

## 2019-09-22 DIAGNOSIS — Z20822 Contact with and (suspected) exposure to covid-19: Secondary | ICD-10-CM | POA: Insufficient documentation

## 2019-09-22 DIAGNOSIS — N72 Inflammatory disease of cervix uteri: Secondary | ICD-10-CM | POA: Insufficient documentation

## 2019-09-22 DIAGNOSIS — F17210 Nicotine dependence, cigarettes, uncomplicated: Secondary | ICD-10-CM | POA: Insufficient documentation

## 2019-09-22 DIAGNOSIS — N730 Acute parametritis and pelvic cellulitis: Secondary | ICD-10-CM | POA: Insufficient documentation

## 2019-09-22 DIAGNOSIS — Z79899 Other long term (current) drug therapy: Secondary | ICD-10-CM | POA: Insufficient documentation

## 2019-09-22 DIAGNOSIS — N889 Noninflammatory disorder of cervix uteri, unspecified: Secondary | ICD-10-CM | POA: Insufficient documentation

## 2019-09-22 DIAGNOSIS — J45909 Unspecified asthma, uncomplicated: Secondary | ICD-10-CM | POA: Insufficient documentation

## 2019-09-22 LAB — COMPREHENSIVE METABOLIC PANEL
ALT: 32 U/L (ref 0–44)
AST: 61 U/L — ABNORMAL HIGH (ref 15–41)
Albumin: 1.8 g/dL — ABNORMAL LOW (ref 3.5–5.0)
Alkaline Phosphatase: 76 U/L (ref 38–126)
Anion gap: 6 (ref 5–15)
BUN: 7 mg/dL (ref 6–20)
CO2: 26 mmol/L (ref 22–32)
Calcium: 7.8 mg/dL — ABNORMAL LOW (ref 8.9–10.3)
Chloride: 101 mmol/L (ref 98–111)
Creatinine, Ser: 0.64 mg/dL (ref 0.44–1.00)
GFR calc Af Amer: >60 mL/min (ref >60)
GFR calc non Af Amer: >60 mL/min (ref >60)
Glucose, Bld: 85 mg/dL (ref 70–99)
Potassium: 3.9 mmol/L (ref 3.5–5.1)
Sodium: 133 mmol/L — ABNORMAL LOW (ref 135–145)
Total Bilirubin: 0.5 mg/dL (ref 0.3–1.2)
Total Protein: 7.6 g/dL (ref 6.5–8.1)

## 2019-09-22 LAB — CBC WITH DIFFERENTIAL/PLATELET
Abs Immature Granulocytes: 0.02 10*3/uL (ref 0.00–0.07)
Basophils Absolute: 0 10*3/uL (ref 0.0–0.1)
Basophils Relative: 1 %
Eosinophils Absolute: 0.2 10*3/uL (ref 0.0–0.5)
Eosinophils Relative: 4 %
HCT: 37.2 % (ref 36.0–46.0)
Hemoglobin: 12.2 g/dL (ref 12.0–15.0)
Immature Granulocytes: 1 %
Lymphocytes Relative: 25 %
Lymphs Abs: 1.1 10*3/uL (ref 0.7–4.0)
MCH: 32.2 pg (ref 26.0–34.0)
MCHC: 32.8 g/dL (ref 30.0–36.0)
MCV: 98.2 fL (ref 80.0–100.0)
Monocytes Absolute: 0.5 10*3/uL (ref 0.1–1.0)
Monocytes Relative: 12 %
Neutro Abs: 2.5 10*3/uL (ref 1.7–7.7)
Neutrophils Relative %: 57 %
Platelets: 320 10*3/uL (ref 150–400)
RBC: 3.79 MIL/uL — ABNORMAL LOW (ref 3.87–5.11)
RDW: 14.5 % (ref 11.5–15.5)
WBC: 4.3 10*3/uL (ref 4.0–10.5)
nRBC: 0 % (ref 0.0–0.2)

## 2019-09-22 LAB — URINALYSIS, ROUTINE W REFLEX MICROSCOPIC
Bilirubin Urine: NEGATIVE
Glucose, UA: NEGATIVE mg/dL
Hgb urine dipstick: NEGATIVE
Ketones, ur: NEGATIVE mg/dL
Leukocytes,Ua: NEGATIVE
Nitrite: NEGATIVE
Protein, ur: NEGATIVE mg/dL
Specific Gravity, Urine: 1.002 — ABNORMAL LOW (ref 1.005–1.030)
pH: 7 (ref 5.0–8.0)

## 2019-09-22 LAB — WET PREP, GENITAL
Clue Cells Wet Prep HPF POC: NONE SEEN
Sperm: NONE SEEN
Trich, Wet Prep: NONE SEEN
Yeast Wet Prep HPF POC: NONE SEEN

## 2019-09-22 LAB — I-STAT BETA HCG BLOOD, ED (MC, WL, AP ONLY): I-stat hCG, quantitative: <5 m[IU]/mL (ref <5)

## 2019-09-22 LAB — POC SARS CORONAVIRUS 2 AG -  ED: SARS Coronavirus 2 Ag: NEGATIVE

## 2019-09-22 LAB — CBG MONITORING, ED: Glucose-Capillary: 113 mg/dL — ABNORMAL HIGH (ref 70–99)

## 2019-09-22 LAB — LIPASE, BLOOD: Lipase: 30 U/L (ref 11–51)

## 2019-09-22 MED ORDER — ACETAMINOPHEN 325 MG PO TABS
650.0000 mg | ORAL_TABLET | Freq: Once | ORAL | Status: AC
  Administered 2019-09-22: 650 mg via ORAL
  Filled 2019-09-22: qty 2

## 2019-09-22 MED ORDER — DOXYCYCLINE HYCLATE 100 MG PO TABS
100.0000 mg | ORAL_TABLET | Freq: Once | ORAL | Status: AC
  Administered 2019-09-22: 100 mg via ORAL
  Filled 2019-09-22: qty 1

## 2019-09-22 MED ORDER — LIDOCAINE HCL (PF) 1 % IJ SOLN
INTRAMUSCULAR | Status: AC
  Administered 2019-09-22: 1 mL
  Filled 2019-09-22: qty 5

## 2019-09-22 MED ORDER — SODIUM CHLORIDE 0.9 % IV BOLUS
1000.0000 mL | Freq: Once | INTRAVENOUS | Status: AC
  Administered 2019-09-22: 18:00:00 1000 mL via INTRAVENOUS

## 2019-09-22 MED ORDER — DOXYCYCLINE HYCLATE 100 MG PO CAPS: 100.0000 mg | ORAL_CAPSULE | Freq: Two times a day (BID) | ORAL | 0 refills | Status: AC

## 2019-09-22 MED ORDER — CEFTRIAXONE SODIUM 500 MG IJ SOLR
250.0000 mg | Freq: Once | INTRAMUSCULAR | Status: AC
  Administered 2019-09-22: 250 mg via INTRAMUSCULAR
  Filled 2019-09-22: qty 500

## 2019-09-22 NOTE — ED Provider Notes (Addendum)
[facility] EMERGENCY DEPARTMENT Provider Note   CSN: [id number] Arrival date & time: 09/22/19  1532     History Chief Complaint  Patient presents with  . Vaginal Discharge  . Polydipsia    BAYLY YANTZ is a 39 y.o. female with history of asthma, otherwise healthy no daily medication use.  Patient presents today for 2 concerns.  Patient's initial concern is vaginal itching x2 weeks, has attempted Monistat without relief.  Reports that over the last 1 days she has developed increased vaginal discharge a yellow discharge intermittent which is new for her.  She is concerned that she may have an STI and reports unprotected sex with one partner.  Additionally patient is concerned that she is dehydrated she reports increased thirst over the 1 week but has been increasing her water intake with some relief.  She denies had any history of fever/chills, recent illness, sick contacts, headache/vision changes, neck stiffness, chest pain/shortness of breath, cough, nausea/vomiting, abdominal pain, pelvic pain, dysuria/hematuria, dyspareunia, rash/lesion, myalgias/arthralgias or any additional concerns.  HPI     Past Medical History:  Diagnosis Date  . Asthma     There are no problems to display for this patient.   Past Surgical History:  Procedure Laterality Date  . HAND SURGERY       OB History   No obstetric history on file.     No family history on file.  Social History   Tobacco Use  . Smoking status: Current Every Day Smoker    Packs/day: 0.50    Types: Cigarettes  Substance Use Topics  . Alcohol use: Yes    Comment: occasionally  . Drug use: No    Home Medications Prior to Admission medications   Medication Sig Start Date End Date Taking? Authorizing Provider  clindamycin (CLEOCIN T) 1 % external solution Apply topically 2 (two) times daily. 05/24/19   Fawze, Mina A, PA-C  doxycycline (VIBRAMYCIN) 100 MG capsule Take 1 capsule (100 mg  total) by mouth 2 (two) times daily for 14 days. 09/22/19 10/06/19  Nuala Alpha A, PA-C  naproxen (NAPROSYN) 500 MG tablet Take 1 tablet (500 mg total) by mouth 2 (two) times daily. 12/26/14   Jeannett Senior, PA-C    Allergies    Patient has no known allergies.  Review of Systems   Review of Systems Ten systems are reviewed and are negative for acute change except as noted in the HPI  Physical Exam Updated Vital Signs BP 117/79   Pulse 87   Temp 98.6 F (37 C) (Oral)   Resp 16   SpO2 100%   Physical Exam Constitutional:      General: She is not in acute distress.    Appearance: Normal appearance. She is well-developed. She is not ill-appearing or diaphoretic.  HENT:     Head: Normocephalic and atraumatic.     Right Ear: External ear normal.     Left Ear: External ear normal.     Nose: Nose normal.  Eyes:     General: Vision grossly intact. Gaze aligned appropriately.     Pupils: Pupils are equal, round, and reactive to light.  Neck:     Trachea: Trachea and phonation normal. No tracheal deviation.  Cardiovascular:     Rate and Rhythm: Normal rate and regular rhythm.     Pulses: Normal pulses.     Heart sounds: Normal heart sounds.  Pulmonary:     Effort: Pulmonary effort is normal. No respiratory distress.  Breath sounds: Normal breath sounds.  Abdominal:     General: There is no distension.     Palpations: Abdomen is soft.     Tenderness: There is no abdominal tenderness. There is no guarding or rebound.  Genitourinary:    Comments: Exam chaperoned by Vernie Shanks NT.  Pelvic exam: normal external genitalia without evidence of trauma. VULVA: normal appearing vulva with no masses, tenderness or lesion. VAGINA: normal appearing vagina with normal color and discharge, no lesions. CERVIX: Friable cervix, minimal cervical motion tenderness, cervical os closed, yellow discharge present through os.   Wet prep and DNA probe for chlamydia and GC obtained.  ADNEXA:  normal adnexa in size, nontender and no masses UTERUS: uterus is normal size, shape, consistency and nontender.  Musculoskeletal:        General: Normal range of motion.     Cervical back: Normal range of motion.  Skin:    General: Skin is warm and dry.  Neurological:     Mental Status: She is alert.     GCS: GCS eye subscore is 4. GCS verbal subscore is 5. GCS motor subscore is 6.     Comments: Speech is clear and goal oriented, follows commands Major Cranial nerves without deficit, no facial droop Moves extremities without ataxia, coordination intact  Psychiatric:        Behavior: Behavior normal.     ED Results / Procedures / Treatments   Labs (all labs ordered are listed, but only abnormal results are displayed) Labs Reviewed  WET PREP, GENITAL - Abnormal; Notable for the following components:      Result Value   WBC, Wet Prep HPF POC MANY (*)    All other components within normal limits  CBC WITH DIFFERENTIAL/PLATELET - Abnormal; Notable for the following components:   RBC 3.79 (*)    All other components within normal limits  COMPREHENSIVE METABOLIC PANEL - Abnormal; Notable for the following components:   Sodium 133 (*)    Calcium 7.8 (*)    Albumin 1.8 (*)    AST 61 (*)    All other components within normal limits  URINALYSIS, ROUTINE W REFLEX MICROSCOPIC - Abnormal; Notable for the following components:   Color, Urine STRAW (*)    Specific Gravity, Urine 1.002 (*)    All other components within normal limits  CBG MONITORING, ED - Abnormal; Notable for the following components:   Glucose-Capillary 113 (*)    All other components within normal limits  NOVEL CORONAVIRUS, NAA (HOSP ORDER, SEND-OUT TO REF LAB; TAT 18-24 HRS)  LIPASE, BLOOD  I-STAT BETA HCG BLOOD, ED ([facility], [facility], [facility] ONLY)  POC SARS CORONAVIRUS 2 AG -  ED  GC/CHLAMYDIA PROBE AMP ([place]) NOT AT [facility]    EKG None  Radiology No results found.  Procedures Procedures (including critical care  time)  Medications Ordered in ED Medications  cefTRIAXone (ROCEPHIN) injection 250 mg (has no administration in time range)  doxycycline (VIBRA-TABS) tablet 100 mg (has no administration in time range)  sodium chloride 0.9 % bolus 1,000 mL (0 mLs Intravenous Stopped 09/22/19 2030)  acetaminophen (TYLENOL) tablet 650 mg (650 mg Oral Given 09/22/19 1735)    ED Course  I have reviewed the triage vital signs and the nursing notes.  Pertinent labs & imaging results that were available during my care of the patient were reviewed by me and considered in my medical decision making (see chart for details).    MDM Rules/Calculators/A&P  On initial evaluation patient is well-appearing no acute distress walking around room.  She reports that she feels dehydrated from 1 week and has had vaginal itching x2 weeks with increased vaginal discharge x1 day.  She denies any pain, recent illness, fevers or any additional concerns.  She is requesting STI testing and treatment.  Triage vital signs reviewed showing fever 100.6 F and tachycardia of 120.  Patient does not appear ill or septic.  I have asked nurse to reobtain vital signs, basic blood work Tylenol and fluid has been ordered. - Prior to patient receiving any fluids or antipyretics a repeat oral temperature shows 98.6 F. - CBG 113 Beta-hCG negative CBC with RBCs 3.79 otherwise within normal limits CMP nonacute Lipase within normal limits Urinalysis straw-colored, gravity 1.002, otherwise within normal limits Rapid Covid negative GC chlamydia pending Patient refused HIV/RPR test Wet prep shows many WBCs otherwise negative Pelvic examination shows purulent discharge through cervical os minimal cervical motion tenderness. - Patient's heart rate improved, no longer febrile, blood pressure stable, no hypoxia or increased work of breathing on room air.  Discussed case with Dr. Maryan Rued, will treat patient for PID at this time and  encourage PCP/OB/GYN follow-up.  Additionally as patient without any other symptoms lower suspicion for COVID-19 viral infection at this time however will send the outpatient Covid test and encourage patient to follow-up on results on her MyChart account.  Patient also states that she is aware to follow-up on her GC chlamydia test on her MyChart account and to discuss results with PCP.  Patient will inform all sexual partners of need of testing and treatment and plans to abstain from sex until all patient partners are tested and treated and symptom-free x2 weeks.  Discussed that patient should practice safe sex with every sexual encounter.  As patient is well-appearing, no acute distress, on reevaluation she is walking around room eating food.  She does not appear septic.  Do not feel imaging or further work-up is indicated at this time.  At this time there does not appear to be any evidence of an acute emergency medical condition and the patient appears stable for discharge with appropriate outpatient follow up. Diagnosis was discussed with patient who verbalizes understanding of care plan and is agreeable to discharge. I have discussed return precautions with patient who verbalizes understanding of return precautions. Patient encouraged to follow-up with their PCP. All questions answered.  Patient's case discussed with Dr. Maryan Rued who agrees with plan to discharge with follow-up.   Note: Portions of this report may have been transcribed using voice recognition software. Every effort was made to ensure accuracy; however, inadvertent computerized transcription errors may still be present. Final Clinical Impression(s) / ED Diagnoses Final diagnoses:  Cervicitis  PID (acute pelvic inflammatory disease)  Cervical lesion    Rx / DC Orders ED Discharge Orders         Ordered    doxycycline (VIBRAMYCIN) 100 MG capsule  2 times daily     09/22/19 2110           Gari Crown 09/22/19  2115    Deliah Boston, PA-C 09/22/19 2117    Blanchie Dessert, MD 09/23/19 1047

## 2019-09-22 NOTE — Discharge Instructions (Signed)
You have been diagnosed today with pelvic inflammatory disease, cervical lesion.  At this time there does not appear to be the presence of an emergent medical condition, however there is always the potential for conditions to change. Please read and follow the below instructions.  Please return to the Emergency Department immediately for any new or worsening symptoms or if your symptoms do not improve within 3 days. Please be sure to follow up with your Primary Care Provider within one week regarding your visit today; please call their office to schedule an appointment even if you are feeling better for a follow-up visit. You are being treated presumptively today for gonorrhea and chlamydia as cause of pelvic inflammatory disease.  Please continue taking the medication doxycycline as prescribed for the next 2 weeks.  You have been tested today for gonorrhea and chlamydia. These results will be available in approximately 3 days. You may check your MyChart account for results. Please inform all sexual partners of positive results and that they should be tested and treated as well. Please wait until 2 weeks after completion of your antibiotic and be sure that you and your partners are symptom free before returning to sexual activity. Please use protection with every sexual encounter. Additionally there appeared to be a lesion on your cervix, I strongly encourage you to call your OB/GYN tomorrow morning to schedule a follow-up appointment not only for recheck of your infection but also for evaluation of your cervix to see if a Pap smear or any additional work-up is needed to ensure there is no abnormal growth there.  If you do not have an OB/GYN you can call the women's clinic on your discharge paperwork to establish one. Additionally your send out Covid test results will be available on your MyChart account in 1-2 days.  Discussed these results with your primary care provider at your follow-up visit this week.   Continue wearing your mask and to isolate to avoid spread of any potential illness.  Get help right away if: You have a fever. You have abnormal vaginal discharge. Your menstrual period is heavier than normal. You develop bright red bleeding. This may include blood clots. You have pain or cramps that get worse, and medicine does not help to relieve your pain. You feel light-headed and you are unusually weak. You have fainting spells. You have pain in the abdomen. You have more pain in the belly area. You have chills. You are not better in 72 hours with treatment You have any new/concerning or worsening of symptoms   Please read the additional information packets attached to your discharge summary.  Do not take your medicine if  develop an itchy rash, swelling in your mouth or lips, or difficulty breathing; call 911 and seek immediate emergency medical attention if this occurs.  Note: Portions of this text may have been transcribed using voice recognition software. Every effort was made to ensure accuracy; however, inadvertent computerized transcription errors may still be present.

## 2019-09-22 NOTE — ED Triage Notes (Signed)
Pt reports vaginal itching x 2 weeks.  Used Monistat.  States she woke up this morning with yellow vaginal discharge.  LMP 2 months ago.  Pt also states she knows she is dehydrated because she stays thirsty.

## 2019-09-22 NOTE — ED Notes (Signed)
Pt ambulated to bathroom multiple times independently. O2 sat remained above 94%

## 2019-09-22 NOTE — ED Notes (Signed)
Pt verbalized understanding of discharge instructions. Pt refused send out covid test. IV removed, pt ambulated independently to lobby.

## 2019-09-23 LAB — GC/CHLAMYDIA PROBE AMP (~~LOC~~) NOT AT ARMC
Chlamydia: NEGATIVE
Neisseria Gonorrhea: NEGATIVE

## 2020-08-11 ENCOUNTER — Other Ambulatory Visit: Payer: Self-pay

## 2020-08-11 ENCOUNTER — Ambulatory Visit (INDEPENDENT_AMBULATORY_CARE_PROVIDER_SITE_OTHER): Payer: Self-pay | Admitting: Family Medicine

## 2020-08-11 VITALS — BP 120/80 | HR 101 | Ht 63.0 in | Wt 99.0 lb

## 2020-08-11 DIAGNOSIS — C8400 Mycosis fungoides, unspecified site: Secondary | ICD-10-CM

## 2020-08-11 DIAGNOSIS — L42 Pityriasis rosea: Secondary | ICD-10-CM

## 2020-08-11 HISTORY — DX: Mycosis fungoides, unspecified site: C84.00

## 2020-08-11 MED ORDER — TRIAMCINOLONE ACETONIDE 0.5 % EX CREA: 1.0000 "application " | TOPICAL_CREAM | Freq: Two times a day (BID) | CUTANEOUS | 2 refills | Status: DC

## 2020-08-11 MED ORDER — LORATADINE 10 MG PO TABS: 10.0000 mg | ORAL_TABLET | Freq: Every day | ORAL | 2 refills | Status: DC

## 2020-08-11 MED ORDER — ACYCLOVIR 400 MG PO TABS: 400.0000 mg | ORAL_TABLET | Freq: Three times a day (TID) | ORAL | 0 refills | Status: AC

## 2020-08-11 NOTE — Patient Instructions (Signed)
Thank you for coming in to see [place] today! This rash is called Pityriasis rosea. It is NOT contagious and can go away on it's own within 4-6 weeks. Please see below to review our plan for today's visit:  1. Try Aquaphor topical to keep your skin moisturized. Oatmeal baths are also great.  2. Take Claritin 1 tablet daily to reduce itchiness.  3. Take Acyclovir 1 tablet 400mg  three times daily for 1 week.  4. Apply Triamcinolone twice daily to the rashy spots.   Please call the clinic at [phone] if your symptoms worsen or you have any concerns. It was our pleasure to serve you!     Dr. Milus Banister [facility]

## 2020-08-11 NOTE — Progress Notes (Signed)
    SUBJECTIVE:   CHIEF COMPLAINT / HPI:   Ms. Ashley Barber is a pleasant patient who presents to clinic as a new patient with concerns for a rash  Rash: She reports having a rash that started about 2-3 weeks ago.  The rash started as a small red, dry patch on her left flank that has since spread to cover the rest of her, abdomen, and is even made its way to her upper and lower extremities.  Coincidentally the rash started when she moved back home with her mom and sister.  She thinks that it might be linked to a change in detergents and soaps.  The patient usually uses Dial, but she switch to [place] sensitive skin and that around the time when her rash started.  About 1-2 weeks ago she switched back to Dial, but the rash has not improved.  She reports 1 week ago the rash started itching.  To make the symptoms better she has tried oatmeal baths, topical hydrocortisone, and often taking a warm shower makes the rash a little bit better.  The rash spares her palms and soles and her face.  She is very concerned about the appearance of the rash and how dark it is, her family was concerned that it might be contagious.   PERTINENT  PMH / PSH: Noncontributory   OBJECTIVE:   BP 120/80   Pulse (!) 101   Ht 5\' 3"  (1.6 m)   Wt 99 lb (44.9 kg)   SpO2 97%   BMI 17.54 kg/m    General: Well-appearing, nontoxic Respiratory: Comfortable work of breathing, CTA bilaterally Cardio: RRR, S1-S2 present Integumentary:   Back   Abdomen   ASSESSMENT/PLAN:   Pityriasis rosea Patient's rash history and appearance most consistent with pityriasis rosea.  Could also possibly consider tinea corporis in the differential, however this is less likely.  Despite history of change in laundry detergent/soap around time of rash outbreak, this does not mimic the appearance of contact dermatitis however should remain on the differential.  This patient was precepted and, although this is usually self-limiting and resolved on its  own in 4-6 weeks, it was decided that due to widespread nature of the rash patient should be treated. -P.o. acyclovir 400 mg 3 times daily x1 week -Claritin 10 mg daily to help with itch -Triamcinolone cream 0.5% to be applied to rash twice daily as a means of reducing pruritus and lightening the hyperpigmented spots     Daisy Floro, [place]

## 2020-08-14 NOTE — Assessment & Plan Note (Signed)
Patient's rash history and appearance most consistent with pityriasis rosea.  Could also possibly consider tinea corporis in the differential, however this is less likely.  Despite history of change in laundry detergent/soap around time of rash outbreak, this does not mimic the appearance of contact dermatitis however should remain on the differential.  This patient was precepted and, although this is usually self-limiting and resolved on its own in 4-6 weeks, it was decided that due to widespread nature of the rash patient should be treated. -P.o. acyclovir 400 mg 3 times daily x1 week -Claritin 10 mg daily to help with itch -Triamcinolone cream 0.5% to be applied to rash twice daily as a means of reducing pruritus and lightening the hyperpigmented spots

## 2021-01-01 NOTE — Progress Notes (Signed)
    SUBJECTIVE:   CHIEF COMPLAINT / HPI:   Rash all over body: Patient presents to clinic today 01/02/2021 for follow-up of a rash all over her body.  Patient was previously seen for this same concern in December 2021.  Due to her history and exam the rash was thought to most likely be pityriasis rosea.  Patient was treated with antiviral therapy p.o. and topical triamcinolone for itchiness.  Today she says the rash has started to spread to her arms again.  She feels like the spots in her abdomen have faded and gotten better, but are definitely not all the way gone.  She reports that occasionally the rash is very itchy.  She has run out of triamcinolone but reports that that helped to decrease the appearance of her rash and the itchiness.  She reports that she has been abstinent for the past 8-9 months, denies any new sexual partners.  Denies seeing any sores or lesions to her vaginal area.  Denies fevers, body aches, chills, joint pains, unintentional weight loss.  Denies using any new products that could be contributing to the rash.  PERTINENT  PMH / PSH:  Patient Active Problem List   Diagnosis Date Noted  . Rash 01/04/2021  . 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 Pityriasis rosea 08/11/2020    OBJECTIVE:   BP 102/60   Pulse 98   Ht $R'5\' 3"'ui$  (1.6 m)   Wt 104 lb (47.2 kg)   LMP 12/19/2020 Comment: Just spoting  SpO2 97%   BMI 18.42 kg/m    Physical exam: General: Well-appearing, nontoxic-appearing Respiratory: Comfortable work of breathing on room air, speaking complete sentences Integumentary: Diffuse erythematous/hyperpigmented patches of papulosquamous rash appreciated to her back (lower back more severe than upper back) as well as to her chest, abdomen, bilateral forearms; blanchable (see photos below)     December 2021 (Back)                  May 2022 (Back)     December 2021 (abdomen)       May 2022 (abdomen)   May 2022 (forearms)  ASSESSMENT/PLAN:   Papulosquamous  dermatosis Patient following up for rash, previously seen 5 months prior.  Was previously thought to be pityriasis rosea.  Rash has improved slightly in appearance, however remains basically the same.  Patient has been applying triamcinolone to the rash to reduce itchiness and severity of parents, reports this has been helpful; however has run out of topical steroid.  Rash is less likely due to pityriasis rosea.  RPR was performed, had false positive RPR as titer is 1:1 and T. pallidum antibodies are negative. Testing is negative for syphilis as cause of rash. ESR, ASO, and ANA were also checked and are within normal limits.  Differential remains broad, including mycosis fungoides, disseminated lupus, tinea corporis, and some form of psoriasis, although none of these exactly match the patient's presentation. -Patient refilled triamcinolone 0.1% with a larger tub, to apply medication to skin twice daily, also given refills -To continue Claritin -Referral to dermatology has been made, however patient reports they cannot see her until October 2022 -Patient will follow-up at clinic Monday 5/9 for biopsy of a lesion.    [place]

## 2021-01-01 NOTE — Patient Instructions (Signed)
Thank you for coming in to see [place] today! Please see below to review our plan for today's visit:  1. We are checking blood work to further work up rash. 2. Apply Triamcinolone topically to skin twice daily. Keep skin moisturized with Aquaphor or Vaseline 2-3 times daily. Avoid super hot showers as this can worsen rash.  3. I have also placed a referral to dermatology.   Please call the clinic at [phone] if your symptoms worsen or you have any concerns. It was our pleasure to serve you!   Dr. Milus Banister [facility]

## 2021-01-02 ENCOUNTER — Other Ambulatory Visit: Payer: Self-pay

## 2021-01-02 ENCOUNTER — Ambulatory Visit (INDEPENDENT_AMBULATORY_CARE_PROVIDER_SITE_OTHER): Payer: Self-pay | Admitting: Family Medicine

## 2021-01-02 VITALS — BP 102/60 | HR 98 | Ht 63.0 in | Wt 104.0 lb

## 2021-01-02 DIAGNOSIS — Z113 Encounter for screening for infections with a predominantly sexual mode of transmission: Secondary | ICD-10-CM

## 2021-01-02 DIAGNOSIS — L988 Other specified disorders of the skin and subcutaneous tissue: Secondary | ICD-10-CM

## 2021-01-02 DIAGNOSIS — L42 Pityriasis rosea: Secondary | ICD-10-CM

## 2021-01-02 DIAGNOSIS — R21 Rash and other nonspecific skin eruption: Secondary | ICD-10-CM

## 2021-01-02 MED ORDER — TRIAMCINOLONE ACETONIDE 0.1 % EX OINT: 1.0000 "application " | TOPICAL_OINTMENT | Freq: Two times a day (BID) | CUTANEOUS | 2 refills | Status: DC

## 2021-01-03 ENCOUNTER — Telehealth: Payer: Self-pay

## 2021-01-03 ENCOUNTER — Telehealth: Payer: Self-pay | Admitting: Dermatology

## 2021-01-03 LAB — SEDIMENTATION RATE: Sed Rate: 26 mm/hr (ref 0–32)

## 2021-01-03 LAB — ANTISTREPTOLYSIN O TITER: ASO: 35 IU/mL (ref 0.0–200.0)

## 2021-01-03 LAB — ANA: Anti Nuclear Antibody (ANA): NEGATIVE

## 2021-01-03 NOTE — Telephone Encounter (Signed)
Patient is calling for a referral appointment from Lissa Morales, MD.  Patient does not want to wait until 06/2021 for an appointment, so wants referral sent back to Dr. McDiarmid.

## 2021-01-03 NOTE — Telephone Encounter (Signed)
Patient calls nurse line stating she just missed "someone's" call. I do not see any notes yet, however I see results. Will forward to PCP.

## 2021-01-03 NOTE — Telephone Encounter (Signed)
Notes documented and referral routed back to referring office. 

## 2021-01-04 DIAGNOSIS — R21 Rash and other nonspecific skin eruption: Secondary | ICD-10-CM | POA: Insufficient documentation

## 2021-01-04 DIAGNOSIS — L988 Other specified disorders of the skin and subcutaneous tissue: Secondary | ICD-10-CM | POA: Insufficient documentation

## 2021-01-04 LAB — RPR, QUANT+TP ABS (REFLEX)
Rapid Plasma Reagin, Quant: 1:1 {titer} — ABNORMAL HIGH
T Pallidum Abs: NONREACTIVE

## 2021-01-04 LAB — RPR: RPR Ser Ql: REACTIVE — AB

## 2021-01-04 NOTE — Assessment & Plan Note (Signed)
Patient following up for rash, previously seen 5 months prior.  Was previously thought to be pityriasis rosea.  Rash has improved slightly in appearance, however remains basically the same.  Patient has been applying triamcinolone to the rash to reduce itchiness and severity of parents, reports this has been helpful; however has run out of topical steroid.  Rash is less likely due to pityriasis rosea.  RPR was performed, had false positive RPR as titer is 1:1 and T. pallidum antibodies are negative. Testing is negative for syphilis as cause of rash. ESR, ASO, and ANA were also checked and are within normal limits.  Differential remains broad, including mycosis fungoides, disseminated lupus, tinea corporis, and some form of psoriasis, although none of these exactly match the patient's presentation. -Patient refilled triamcinolone 0.1% with a larger tub, to apply medication to skin twice daily, also given refills -To continue Claritin -Referral to dermatology has been made, however patient reports they cannot see her until October 2022 -Patient will follow-up at clinic Monday 5/9 for biopsy of a lesion.

## 2021-01-09 ENCOUNTER — Ambulatory Visit (INDEPENDENT_AMBULATORY_CARE_PROVIDER_SITE_OTHER): Payer: Self-pay | Admitting: Family Medicine

## 2021-01-09 ENCOUNTER — Ambulatory Visit (INDEPENDENT_AMBULATORY_CARE_PROVIDER_SITE_OTHER): Payer: Medicaid Other | Admitting: Family Medicine

## 2021-01-09 ENCOUNTER — Encounter: Payer: Self-pay | Admitting: Family Medicine

## 2021-01-09 ENCOUNTER — Other Ambulatory Visit: Payer: Self-pay

## 2021-01-09 VITALS — BP 101/80 | HR 86 | Ht 63.0 in | Wt 105.2 lb

## 2021-01-09 DIAGNOSIS — R768 Other specified abnormal immunological findings in serum: Secondary | ICD-10-CM

## 2021-01-09 DIAGNOSIS — L988 Other specified disorders of the skin and subcutaneous tissue: Secondary | ICD-10-CM

## 2021-01-09 NOTE — Progress Notes (Signed)
Erroneous Encounter -patient rescheduled for this afternoon

## 2021-01-11 NOTE — Progress Notes (Signed)
    SUBJECTIVE:   CHIEF COMPLAINT / HPI:   Papulosquamous rash: Patient presents to clinic today for follow-up for her widespread rash.  Patient presents for punch biopsy of this rash.  Procedure was explained to patient.  Consent form was signed and scanned into patient's chart.   PERTINENT  PMH / PSH:  Patient Active Problem List   Diagnosis Date Noted  . Biological false positive RPR test 01/09/2021  . Rash 01/04/2021  . 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 Pityriasis rosea 08/11/2020     OBJECTIVE:   BP 101/80   Pulse 86   Ht 5\' 3"  (1.6 m)   Wt 105 lb 3.2 oz (47.7 kg)   LMP 01/05/2021 Comment: Just spoting  SpO2 98%   BMI 18.64 kg/m    Physical exam: General: Well-appearing, no acute distress Respiratory: Comfortable work of breathing on room air Integumentary: Patient's papulosquamous rash appears slightly improved after treatment with triamcinolone.   Rash located to left arm  PUNCH BIOPSY After informed written consent was obtained, using Betadine for cleansing and 1% Lidocaine without epinephrine for anesthetic, with sterile technique, 2 x 22mm punch biopsies were used to obtain 2 biopsy specimen of the lesion (1 specimen from the rim of the lesion, 1 specimen from center of lesion). Hemostasis was obtained by pressure and wound was sutured using 5-0 Prolene. Antibiotic dressing is applied, and wound care instructions provided. Be alert for any signs of cutaneous infection. The specimen were labeled and sent to pathology for evaluation. The procedure was well tolerated without complications.    ASSESSMENT/PLAN:   Papulosquamous dermatosis Punch biopsy performed at today's office visit.   -Instructed to keep the punch biopsy site gently cleaned with just soap and water.   -Cover the suture site with bacitracin/Neosporin and keep covered with bandage but this is gauze, Band-Aid). -Patient instructed to remove sutures in 7-10 days after placement -Was  instructed to come back for removal of sutures if she was unable to do the home     [place]

## 2021-01-11 NOTE — Assessment & Plan Note (Signed)
Punch biopsy performed at today's office visit.   -Instructed to keep the punch biopsy site gently cleaned with just soap and water.   -Cover the suture site with bacitracin/Neosporin and keep covered with bandage but this is gauze, Band-Aid). -Patient instructed to remove sutures in 7-10 days after placement -Was instructed to come back for removal of sutures if she was unable to do the home

## 2021-01-19 ENCOUNTER — Telehealth: Payer: Self-pay

## 2021-01-19 NOTE — Progress Notes (Signed)
    SUBJECTIVE:   CHIEF COMPLAINT / HPI:   Left Arm Suture Infection Punch biopsy performed on 5/9 Over the last few days has had worsening of redness, tenderness in the area No fevers Otherwise feeling well Hasn't noticed any pus  PERTINENT  PMH / PSH: History of rash  OBJECTIVE:   BP 126/64   Pulse 88   Ht 5\' 3"  (1.6 m)   Wt 102 lb (46.3 kg)   LMP 01/05/2021 Comment: Just spoting  SpO2 98%   BMI 18.07 kg/m    Physical Exam:  General: 40 y.o. female in NAD Lungs: Breathing comfortably on room air Skin: left upper forearm with two small lesions with one suture a piece, surrounding erythema to about 1cm margin, after suture removal, purulent drainage expressed from most inferior lesion  ASSESSMENT/PLAN:   Cellulitis of left upper extremity Exam consistent with purulent cellulitis of the left upper forearm.  She has no systemic symptoms.  We will treat with doxycycline 100 mg twice daily for 7 days.  Return to care in 1 week for follow-up with her PCP.  Return precautions discussed including worsening of redness, worsening of pain, fever, more purulent drainage.  Bacitracin placed on the wound with a Band-Aid.  She was advised that she can use this for the next few days, but does not need to continue past that point given that she will be on oral antibiotics.     Cleophas Dunker, [place]

## 2021-01-19 NOTE — Telephone Encounter (Signed)
Patient calls nurse line reporting she had a punch biopsy done on 5/9 and feels the site is infected. Patient reports redness, warmth, and pain to the area. Patient denies body aches, fever or chills. Patient scheduled for tomorrow for evaluation and strict ED precautions given to patient.

## 2021-01-20 ENCOUNTER — Telehealth: Payer: Self-pay | Admitting: Hematology

## 2021-01-20 ENCOUNTER — Ambulatory Visit (INDEPENDENT_AMBULATORY_CARE_PROVIDER_SITE_OTHER): Payer: Self-pay | Admitting: Family Medicine

## 2021-01-20 ENCOUNTER — Other Ambulatory Visit: Payer: Self-pay

## 2021-01-20 ENCOUNTER — Encounter: Payer: Self-pay | Admitting: Family Medicine

## 2021-01-20 ENCOUNTER — Other Ambulatory Visit: Payer: Self-pay | Admitting: Family Medicine

## 2021-01-20 VITALS — BP 126/64 | HR 88 | Ht 63.0 in | Wt 102.0 lb

## 2021-01-20 DIAGNOSIS — L03114 Cellulitis of left upper limb: Secondary | ICD-10-CM | POA: Insufficient documentation

## 2021-01-20 DIAGNOSIS — C8408 Mycosis fungoides, lymph nodes of multiple sites: Secondary | ICD-10-CM

## 2021-01-20 MED ORDER — DOXYCYCLINE HYCLATE 100 MG PO TABS: 100.0000 mg | ORAL_TABLET | Freq: Two times a day (BID) | ORAL | 0 refills | Status: AC

## 2021-01-20 NOTE — Assessment & Plan Note (Signed)
Exam consistent with purulent cellulitis of the left upper forearm.  She has no systemic symptoms.  We will treat with doxycycline 100 mg twice daily for 7 days.  Return to care in 1 week for follow-up with her PCP.  Return precautions discussed including worsening of redness, worsening of pain, fever, more purulent drainage.  Bacitracin placed on the wound with a Band-Aid.  She was advised that she can use this for the next few days, but does not need to continue past that point given that she will be on oral antibiotics.

## 2021-01-20 NOTE — Patient Instructions (Signed)
Thank you for coming to see me today. It was a pleasure. Today we talked about:   We will treat you with doxycyline 100mg  twice a day for 7 days.  Eat yogurt while you take it.  This can make you more sensitive to the sun, so keep this in mind.    Come back to see Dr. Ouida Sills, in a week if this is not improved.  If you start having fevers, have worsening pain or redness, worsening drainage, please come back right away.  If you have any questions or concerns, please do not hesitate to call the office at [phone].  Best,   [place] Constable, DO

## 2021-01-20 NOTE — Telephone Encounter (Signed)
Received a new pt referral from Dr. Ouida Sills for mycosis fungoides. I cld and lft Ashley Barber a vm to call me back to inform her of he appt.

## 2021-01-21 ENCOUNTER — Encounter: Payer: Self-pay | Admitting: Family Medicine

## 2021-01-23 ENCOUNTER — Encounter: Payer: Self-pay | Admitting: Family Medicine

## 2021-01-25 ENCOUNTER — Telehealth: Payer: Self-pay | Admitting: Hematology

## 2021-01-25 NOTE — Telephone Encounter (Signed)
Pt cld and rescheduled her new hem appt to 6/2 at 1pm to see Dr. Irene Limbo.

## 2021-01-26 ENCOUNTER — Inpatient Hospital Stay: Payer: Medicaid Other | Admitting: Hematology

## 2021-01-27 MED ORDER — FAMOTIDINE 20 MG PO TABS: 20.0000 mg | ORAL_TABLET | Freq: Every day | ORAL | 3 refills | Status: DC
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
## 2021-02-02 ENCOUNTER — Inpatient Hospital Stay: Payer: Medicaid Other | Attending: Hematology | Admitting: Hematology

## 2021-02-02 ENCOUNTER — Other Ambulatory Visit: Payer: Self-pay

## 2021-02-02 VITALS — BP 114/77 | HR 82 | Temp 98.9°F | Resp 18 | Wt 101.7 lb

## 2021-02-02 DIAGNOSIS — Z113 Encounter for screening for infections with a predominantly sexual mode of transmission: Secondary | ICD-10-CM | POA: Insufficient documentation

## 2021-02-02 DIAGNOSIS — Z87828 Personal history of other (healed) physical injury and trauma: Secondary | ICD-10-CM | POA: Insufficient documentation

## 2021-02-02 DIAGNOSIS — R21 Rash and other nonspecific skin eruption: Secondary | ICD-10-CM

## 2021-02-02 DIAGNOSIS — F17210 Nicotine dependence, cigarettes, uncomplicated: Secondary | ICD-10-CM | POA: Insufficient documentation

## 2021-02-02 DIAGNOSIS — C8409 Mycosis fungoides, extranodal and solid organ sites: Secondary | ICD-10-CM | POA: Insufficient documentation

## 2021-02-03 ENCOUNTER — Telehealth: Payer: Self-pay | Admitting: Hematology

## 2021-02-03 NOTE — Telephone Encounter (Signed)
Left message with follow-up appointment per 6/2 los. Gave option to call back to reschedule if needed.

## 2021-02-09 ENCOUNTER — Telehealth: Payer: Self-pay

## 2021-02-09 MED ORDER — PREDNISONE 5 MG PO TABS: ORAL_TABLET | ORAL | 0 refills | Status: DC

## 2021-02-09 NOTE — Telephone Encounter (Signed)
Contacted pt to let her know per Dr Irene Limbo: let patient know to pick up her prescription and continue her prednisone taper.  Pt verbalized understanding.

## 2021-02-25 DIAGNOSIS — Z3042 Encounter for surveillance of injectable contraceptive: Secondary | ICD-10-CM | POA: Insufficient documentation
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
## 2021-02-25 NOTE — Patient Instructions (Addendum)
Thank you for coming in to see [place] today! Please see below to review our plan for today's visit:  1.  You received your Depo shot and COVID vaccine today's visit!  We confirmed no pregnancy with a negative pregnancy test.  Please come back in 3 months for repeat Depo shot (September 2022). 2.  I have prescribed topical Voltaren gel for you to apply to your left hand up to 4 times daily to help decrease pain/stiffness.  You can take 1 tablet of Tylenol 500 mg 3-4 times daily as needed for severe pain.  Continue your warm compresses if that makes your hand feel better.  I have also placed a referral to Occupational Therapy for "hand therapy".  They will call you at the number listed in the chart [phone], look out for phone numbers you may not recognize because this might be the Occupational Therapy office calling to schedule you for an appointment! 3.  Your next appointment with Dr. Irene Limbo is 03/17/2021 at 1 PM sharp for lab visit and doctor visit. 4.  Bring your disability paperwork to the office and we will get it filled out!  Please call the clinic at [phone] if your symptoms worsen or you have any concerns. It was our pleasure to serve you!   Dr. Milus Banister [facility]  Medroxyprogesterone Injection (Contraception) What is this medication? MEDROXYPROGESTERONE (me DROX ee proe JES te rone) prevents ovulation and pregnancy. It belongs to a group of medications called contraceptives. Thismedication is a progestin hormone. This medicine may be used for other purposes; ask your health care provider orpharmacist if you have questions. COMMON BRAND NAME(S): Depo-Provera, Depo-subQ Provera 104 What should I tell my care team before I take this medication? They need to know if you have any of these conditions: Asthma Blood clots Breast cancer or family history of breast cancer Depression Diabetes Eating disorder (anorexia nervosa) Heart attack High blood pressure HIV  infection or AIDS If you often drink alcohol Kidney disease Liver disease Migraine headaches Osteoporosis, weak bones Seizures Stroke Tobacco smoker Vaginal bleeding An unusual or allergic reaction to medroxyprogesterone, other hormones, medications, foods, dyes, or preservatives Pregnant or trying to get pregnant Breast-feeding How should I use this medication? Depo-Provera CI contraceptive injection is given into a muscle. Depo-subQ Provera 104 injection is given under the skin. It is given in a hospital or clinic setting. The injection is usually given during the first 5 days afterthe start of a menstrual period or 6 weeks after delivery of a baby. A patient package insert for the product will be given with each prescription and refill. Be sure to read this information carefully each time. The sheet maychange often. Talk to your care team about the use of this medication in children. Special care may be needed. These injections have been used in female children who havestarted having menstrual periods. Overdosage: If you think you have taken too much of this medicine contact apoison control center or emergency room at once. NOTE: This medicine is only for you. Do not share this medicine with others. What if I miss a dose? Keep appointments for follow-up doses. You must get an injection once every 3 months. It is important not to miss your dose. Call your care team if you areunable to keep an appointment. What may interact with this medication? Antibiotics or medications for infections, especially rifampin and griseofulvin Antivirals for HIV or hepatitis Aprepitant Armodafinil Bexarotene Bosentan Medications for seizures like carbamazepine, felbamate, oxcarbazepine,  phenytoin, phenobarbital, primidone, topiramate Mitotane Modafinil St. John's wort This list may not describe all possible interactions. Give your health care provider a list of all the medicines, herbs, non-prescription  drugs, or dietary supplements you use. Also tell them if you smoke, drink alcohol, or use illegaldrugs. Some items may interact with your medicine. What should I watch for while using this medication? This medication does not protect you against HIV infection (AIDS) or othersexually transmitted diseases. Use of this product may cause you to lose calcium from your bones. Loss of calcium may cause weak bones (osteoporosis). Only use this product for more than 2 years if other forms of birth control are not right for you. The longer you use this product for birth control the more likely you will be at risk forweak bones. Ask your care team how you can keep strong bones. You may have a change in bleeding pattern or irregular periods. Many femalesstop having periods while taking this medication. If you have received your injections on time, your chance of being pregnant is very low. If you think you may be pregnant, see your care team as soon aspossible. Tell your care team if you want to get pregnant within the next year. The effect of this medication may last a long time after you get your lastinjection. What side effects may I notice from receiving this medication? Side effects that you should report to your care team as soon as possible: Allergic reactions-skin rash, itching, hives, swelling of the face, lips, tongue, or throat Blood clot-pain, swelling, or warmth in the leg, shortness of breath, chest pain Gallbladder problems-severe stomach pain, nausea, vomiting, fever Increase in blood pressure Liver injury-right upper belly pain, loss of appetite, nausea, light-colored stool, dark yellow or brown urine, yellowing skin or eyes, unusual weakness or fatigue New or worsening migraines or headaches Seizures Stroke-sudden numbness or weakness of the face, arm, or leg, trouble speaking, confusion, trouble walking, loss of balance or coordination, dizziness, severe headache, change in vision Unusual  vaginal discharge, itching, or odor Worsening mood, feelings of depression Side effects that usually do not require medical attention (report to your careteam if they continue or are bothersome): Breast pain or tenderness Dark patches of the skin on the face or other sun-exposed areas Irregular menstrual cycles or spotting Nausea Weight gain This list may not describe all possible side effects. Call your doctor for medical advice about side effects. You may report side effects to FDA at1-800-FDA-1088. Where should I keep my medication? This injection is only given by a care team. It will not be stored at home. NOTE: This sheet is a summary. It may not cover all possible information. If you have questions about this medicine, talk to your doctor, pharmacist, orhealth care provider.  2022 Elsevier/Gold Standard (2020-09-26 12:23:33)

## 2021-02-27 ENCOUNTER — Ambulatory Visit (INDEPENDENT_AMBULATORY_CARE_PROVIDER_SITE_OTHER): Payer: Medicaid Other | Admitting: Family Medicine

## 2021-02-27 ENCOUNTER — Ambulatory Visit (INDEPENDENT_AMBULATORY_CARE_PROVIDER_SITE_OTHER): Payer: Medicaid Other

## 2021-02-27 ENCOUNTER — Other Ambulatory Visit: Payer: Self-pay

## 2021-02-27 ENCOUNTER — Encounter: Payer: Self-pay | Admitting: Family Medicine

## 2021-02-27 VITALS — BP 124/70 | HR 91 | Ht 63.0 in | Wt 99.5 lb

## 2021-02-27 DIAGNOSIS — Z23 Encounter for immunization: Secondary | ICD-10-CM

## 2021-02-27 DIAGNOSIS — Z3042 Encounter for surveillance of injectable contraceptive: Secondary | ICD-10-CM

## 2021-02-27 DIAGNOSIS — S6992XS Unspecified injury of left wrist, hand and finger(s), sequela: Secondary | ICD-10-CM | POA: Insufficient documentation

## 2021-02-27 LAB — POCT URINE PREGNANCY: Preg Test, Ur: NEGATIVE

## 2021-02-27 MED ORDER — MEDROXYPROGESTERONE ACETATE 150 MG/ML IM SUSP
150.0000 mg | Freq: Once | INTRAMUSCULAR | Status: AC
  Administered 2021-02-27: 150 mg via INTRAMUSCULAR

## 2021-02-27 MED ORDER — DICLOFENAC SODIUM 1 % EX GEL: 2.0000 g | Freq: Four times a day (QID) | CUTANEOUS | 3 refills | Status: DC

## 2021-02-27 NOTE — Assessment & Plan Note (Signed)
Patient received her COVID-vaccine at today's visit, was monitored for 15 minutes and tolerated vaccine without complication

## 2021-02-27 NOTE — Assessment & Plan Note (Signed)
Urine pregnancy test negative, patient has not been recently sexually active. -Depo shot administered at today's visit, can repeat in September 2022

## 2021-02-27 NOTE — Assessment & Plan Note (Signed)
Patient with history of previous left hand injury that has resulted in dysfunction.  Pain prevents patient from using hand sometimes.  Patient currently works as a Theme park manager.  Is inquiring about disability. -Voltaren gel prescribed for pain, can be applied topically 4 times daily -Patient referred to occupational therapy for hand therapy -Patient to drop off information for disability paperwork

## 2021-03-09 ENCOUNTER — Other Ambulatory Visit: Payer: Self-pay

## 2021-03-09 ENCOUNTER — Encounter: Payer: Self-pay | Admitting: Occupational Therapy

## 2021-03-09 ENCOUNTER — Ambulatory Visit: Payer: Self-pay | Attending: Family Medicine | Admitting: Occupational Therapy

## 2021-03-09 DIAGNOSIS — M25642 Stiffness of left hand, not elsewhere classified: Secondary | ICD-10-CM | POA: Insufficient documentation

## 2021-03-09 DIAGNOSIS — R252 Cramp and spasm: Secondary | ICD-10-CM | POA: Insufficient documentation

## 2021-03-09 DIAGNOSIS — M25542 Pain in joints of left hand: Secondary | ICD-10-CM | POA: Insufficient documentation

## 2021-03-09 DIAGNOSIS — M6281 Muscle weakness (generalized): Secondary | ICD-10-CM | POA: Insufficient documentation

## 2021-03-09 DIAGNOSIS — R600 Localized edema: Secondary | ICD-10-CM | POA: Insufficient documentation
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
## 2021-03-16 ENCOUNTER — Ambulatory Visit: Payer: Self-pay | Admitting: Occupational Therapy

## 2021-03-16 ENCOUNTER — Other Ambulatory Visit: Payer: Self-pay

## 2021-03-16 ENCOUNTER — Encounter: Payer: Self-pay | Admitting: Occupational Therapy

## 2021-03-16 DIAGNOSIS — M25542 Pain in joints of left hand: Secondary | ICD-10-CM

## 2021-03-16 DIAGNOSIS — M25642 Stiffness of left hand, not elsewhere classified: Secondary | ICD-10-CM
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
## 2021-03-16 NOTE — Patient Instructions (Addendum)
   Flexor Tendon Gliding (Active Hook Fist)   With fingers and knuckles straight, bend middle and tip joints. Do not bend large knuckles. Repeat 10 times. Do 3 sessions per day.   Flexor Tendon Gliding (Active Full Fist)   Straighten all fingers, then make a fist, bending all joints. Repeat 10 times. Do 3sessions per day.   Flexor Tendon Gliding (Active Straight Fist)   Start with fingers straight. Bend knuckles and middle joints. Keep fingertip joints straight to touch base of palm. Repeat 10 times. Do 3 sessions per day.   MP Flexion (Active Isolated)   Bend ALL fingers at large knuckle, keeping other fingers straight. Do not bend tips. Repeat 10 times. Do 3 sessions per day.    AROM: Wrist Extension   .  With left palm down, bend wrist up. Repeat __10__ times per set.  Do __1-2__ sessions per day.    AROM: Wrist Radial Deviation    With left thumb up, bend wrist up.  Then down slowly Repeat 10  times per set. Do 1-2 times per day.      Coordination Activities  Perform the following activities for 15 minutes 1 times per day with left hand(s).  Rotate ball in fingertips (clockwise and counter-clockwise). Flip cards 1 at a time as fast as you can. Deal cards with your thumb (Hold deck in hand and push card off top with thumb). Pick up coins and stack. Pick up coins one at a time until you get 5-10 in your hand, then move coins from palm to fingertips to place in container one at a time. Rotate 2 golf balls in hand.

## 2021-03-17 ENCOUNTER — Inpatient Hospital Stay: Payer: Self-pay | Attending: Hematology | Admitting: Hematology

## 2021-03-17 ENCOUNTER — Inpatient Hospital Stay: Payer: Self-pay

## 2021-03-17 ENCOUNTER — Other Ambulatory Visit: Payer: Self-pay

## 2021-03-17 VITALS — BP 109/82 | HR 68 | Temp 99.0°F | Resp 18 | Ht 63.0 in | Wt 99.2 lb

## 2021-03-17 DIAGNOSIS — Z87828 Personal history of other (healed) physical injury and trauma: Secondary | ICD-10-CM | POA: Insufficient documentation

## 2021-03-17 DIAGNOSIS — Z7952 Long term (current) use of systemic steroids: Secondary | ICD-10-CM | POA: Insufficient documentation

## 2021-03-17 DIAGNOSIS — C8409 Mycosis fungoides, extranodal and solid organ sites: Secondary | ICD-10-CM

## 2021-03-17 DIAGNOSIS — J45909 Unspecified asthma, uncomplicated: Secondary | ICD-10-CM | POA: Insufficient documentation

## 2021-03-17 DIAGNOSIS — N63 Unspecified lump in unspecified breast: Secondary | ICD-10-CM | POA: Insufficient documentation

## 2021-03-17 DIAGNOSIS — Z880 Allergy status to penicillin: Secondary | ICD-10-CM | POA: Insufficient documentation

## 2021-03-17 DIAGNOSIS — F17210 Nicotine dependence, cigarettes, uncomplicated: Secondary | ICD-10-CM | POA: Insufficient documentation

## 2021-03-17 LAB — CMP (CANCER CENTER ONLY)
ALT: 9 U/L (ref 0–44)
AST: 24 U/L (ref 15–41)
Albumin: 2.8 g/dL — ABNORMAL LOW (ref 3.5–5.0)
Alkaline Phosphatase: 61 U/L (ref 38–126)
Anion gap: 4 — ABNORMAL LOW (ref 5–15)
BUN: <4 mg/dL — ABNORMAL LOW (ref 6–20)
CO2: 27 mmol/L (ref 22–32)
Calcium: 8.8 mg/dL — ABNORMAL LOW (ref 8.9–10.3)
Chloride: 102 mmol/L (ref 98–111)
Creatinine: 0.71 mg/dL (ref 0.44–1.00)
GFR, Estimated: >60 mL/min (ref >60)
Glucose, Bld: 85 mg/dL (ref 70–99)
Potassium: 3.9 mmol/L (ref 3.5–5.1)
Sodium: 133 mmol/L — ABNORMAL LOW (ref 135–145)
Total Bilirubin: <0.2 mg/dL — ABNORMAL LOW (ref 0.3–1.2)
Total Protein: 10.6 g/dL — ABNORMAL HIGH (ref 6.5–8.1)

## 2021-03-17 LAB — CBC WITH DIFFERENTIAL/PLATELET
Abs Immature Granulocytes: 0.01 10*3/uL (ref 0.00–0.07)
Basophils Absolute: 0 10*3/uL (ref 0.0–0.1)
Basophils Relative: 0 %
Eosinophils Absolute: 0 10*3/uL (ref 0.0–0.5)
Eosinophils Relative: 0 %
HCT: 36.8 % (ref 36.0–46.0)
Hemoglobin: 12.9 g/dL (ref 12.0–15.0)
Immature Granulocytes: 0 %
Lymphocytes Relative: 22 %
Lymphs Abs: 1 10*3/uL (ref 0.7–4.0)
MCH: 33 pg (ref 26.0–34.0)
MCHC: 35.1 g/dL (ref 30.0–36.0)
MCV: 94.1 fL (ref 80.0–100.0)
Monocytes Absolute: 0.1 10*3/uL (ref 0.1–1.0)
Monocytes Relative: 3 %
Neutro Abs: 3.4 10*3/uL (ref 1.7–7.7)
Neutrophils Relative %: 75 %
Platelets: 260 10*3/uL (ref 150–400)
RBC: 3.91 MIL/uL (ref 3.87–5.11)
RDW: 13.2 % (ref 11.5–15.5)
WBC: 4.6 10*3/uL (ref 4.0–10.5)
nRBC: 0 % (ref 0.0–0.2)

## 2021-03-17 LAB — HEPATITIS C ANTIBODY: HCV Ab: NONREACTIVE

## 2021-03-17 LAB — HIV ANTIBODY (ROUTINE TESTING W REFLEX): HIV Screen 4th Generation wRfx: REACTIVE — AB

## 2021-03-17 LAB — HEPATITIS B SURFACE ANTIGEN: Hepatitis B Surface Ag: NONREACTIVE

## 2021-03-17 LAB — LACTATE DEHYDROGENASE: LDH: 248 U/L — ABNORMAL HIGH (ref 98–192)

## 2021-03-17 LAB — HEPATITIS B CORE ANTIBODY, TOTAL: Hep B Core Total Ab: NONREACTIVE

## 2021-03-18 ENCOUNTER — Telehealth: Payer: Self-pay | Admitting: Infectious Disease

## 2021-03-18 NOTE — Telephone Encounter (Signed)
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 I would recommend getting an HIV RNA quant. It comes back in 2 days typically and is not ambigious  We would be very happy to see her ASAP at [facility] but we not have a relationship with her yet.  We frequently also send this type of information to the DIS officers at the health department but I do not think they take incomplete HIV tests  Would you be comfortable or your staff reaching out to her to let her know she needs further testing which you could do (or we could also do at our clinic?

## 2021-03-20 ENCOUNTER — Telehealth: Payer: Self-pay | Admitting: Hematology

## 2021-03-20 NOTE — Telephone Encounter (Signed)
Scheduled follow-up appointment per 7/15 los. Patient is aware.

## 2021-03-22 ENCOUNTER — Ambulatory Visit: Payer: Self-pay | Admitting: Occupational Therapy

## 2021-03-22 LAB — SURGICAL PATHOLOGY

## 2021-03-23 LAB — FLOW CYTOMETRY

## 2021-03-27 ENCOUNTER — Other Ambulatory Visit: Payer: Self-pay | Admitting: Hematology

## 2021-03-27 DIAGNOSIS — Z21 Asymptomatic human immunodeficiency virus [HIV] infection status: Secondary | ICD-10-CM

## 2021-03-29 ENCOUNTER — Ambulatory Visit: Payer: Self-pay | Admitting: Occupational Therapy

## 2021-03-29 NOTE — Telephone Encounter (Signed)
I spoke with [name] with Dr. Grier Mitts office and she has been trying to get in contact with the patient to give her results and have her come in for additional testing.  Ashley Barber

## 2021-03-30 ENCOUNTER — Telehealth: Payer: Self-pay | Admitting: Hematology

## 2021-03-30 ENCOUNTER — Telehealth: Payer: Self-pay | Admitting: General Practice

## 2021-03-30 ENCOUNTER — Ambulatory Visit: Payer: Medicaid Other | Admitting: Occupational Therapy

## 2021-03-30 DIAGNOSIS — Z21 Asymptomatic human immunodeficiency virus [HIV] infection status: Secondary | ICD-10-CM

## 2021-03-30 DIAGNOSIS — C8409 Mycosis fungoides, extranodal and solid organ sites: Secondary | ICD-10-CM

## 2021-03-30 NOTE — Telephone Encounter (Signed)
[facility] CSW Progress Notes  Call to patient per referral from medical oncologist for help w transportation and other psychosocial issues.  No answer, left VM w my contact information and request to return the call.  Edwyna Shell, LCSW Clinical Social Worker Phone:  [phone]

## 2021-03-30 NOTE — Telephone Encounter (Signed)
Called patient and after receiving her consent to discuss results talk to her about her positive HIV antibody test and the need for additional confirmatory testing including HIV RNA viral load and CD4 counts. The specimen order.  She has been scheduled to have the testing done tomorrow.  She however notes that she may not have a ride.  She is asked to come in today or anytime to get this test done as soon as possible.  We will refer the patient to social worker to address barriers to care including transportation.  Will also refer the patient to Dr. For further evaluation and management of her new HIV antibody positive status and RPR positive status.  Ashley Barber

## 2021-03-31 ENCOUNTER — Ambulatory Visit (HOSPITAL_COMMUNITY): Payer: Self-pay

## 2021-03-31 ENCOUNTER — Telehealth: Payer: Self-pay | Admitting: General Practice

## 2021-03-31 NOTE — Telephone Encounter (Signed)
[facility] CSW Progress Notes  Second call to patient to address urgent referral, unable to reach patient, left generic VM w my contact information and encouragement to call back.  Edwyna Shell, LCSW Clinical Social Worker Phone:  [phone]

## 2021-04-02 ENCOUNTER — Other Ambulatory Visit: Payer: Self-pay | Admitting: Family Medicine

## 2021-04-04 ENCOUNTER — Encounter: Payer: Self-pay | Admitting: *Deleted

## 2021-04-04 NOTE — Progress Notes (Signed)
[facility] Work  Clinical Social Work received referral from medical oncology for transportation and assessment of needs.  CSW made 3rd attempt to contact patient.  CSW left a voicemail offering support and encouraging patient to return call.  Johnnye Lana, MSW, LCSW, OSW-C Clinical Social Worker [facility] [phone]

## 2021-04-05 ENCOUNTER — Ambulatory Visit: Payer: Medicaid Other | Admitting: Occupational Therapy

## 2021-04-05 ENCOUNTER — Ambulatory Visit (HOSPITAL_COMMUNITY): Payer: Self-pay

## 2021-04-07 ENCOUNTER — Other Ambulatory Visit: Payer: Self-pay

## 2021-04-07 DIAGNOSIS — C8409 Mycosis fungoides, extranodal and solid organ sites: Secondary | ICD-10-CM

## 2021-04-07 MED ORDER — PREDNISONE 5 MG PO TABS: ORAL_TABLET | ORAL | 0 refills | Status: DC

## 2021-04-10 ENCOUNTER — Ambulatory Visit: Payer: Self-pay | Admitting: Infectious Disease

## 2021-04-10 ENCOUNTER — Ambulatory Visit: Payer: Medicaid Other

## 2021-04-10 ENCOUNTER — Other Ambulatory Visit (HOSPITAL_COMMUNITY): Payer: Self-pay

## 2021-04-10 ENCOUNTER — Telehealth: Payer: Self-pay

## 2021-04-10 NOTE — Telephone Encounter (Signed)
[facility] Patient Teacher, English as a foreign language completed.    The patient is uninsured and will need patient assistance for medication.  Patient have Family-Waiving plan through [facility] Medicaid.   We can complete the application and will need to meet with the patient for signatures and income documentation.  Ileene Patrick, [facility] Patient [facility] Phone: [phone] Fax:  [phone]

## 2021-04-12 ENCOUNTER — Encounter: Payer: Medicaid Other | Admitting: Occupational Therapy

## 2021-04-13 ENCOUNTER — Telehealth: Payer: Self-pay | Admitting: Hematology

## 2021-04-13 NOTE — Telephone Encounter (Signed)
Called to reschedule upcoming appointment due to provider's emergency. Patient opted to call to schedule scan and then reschedule provider visit.

## 2021-04-14 ENCOUNTER — Inpatient Hospital Stay: Payer: Self-pay | Admitting: Hematology

## 2021-04-17 ENCOUNTER — Ambulatory Visit: Payer: Self-pay | Admitting: Infectious Disease

## 2021-04-17 ENCOUNTER — Ambulatory Visit: Payer: Medicaid Other

## 2021-04-19 ENCOUNTER — Encounter: Payer: Medicaid Other | Admitting: Occupational Therapy

## 2021-04-27 ENCOUNTER — Ambulatory Visit: Payer: Medicaid Other

## 2021-04-28 NOTE — Progress Notes (Signed)
Patient ID: Ashley Barber, female   DOB: [birth year], 40 y.o.   MRN: [id number] Called Patient to reschedule appointment she has been scheduled twice on 04-10-21  and 04-17-21   tried to call to reschedule and voice mail not setup

## 2021-05-02 ENCOUNTER — Ambulatory Visit (HOSPITAL_COMMUNITY): Payer: Medicaid Other

## 2021-05-12 ENCOUNTER — Telehealth: Payer: Self-pay | Admitting: *Deleted

## 2021-05-12 NOTE — Telephone Encounter (Signed)
RN spoke with Jamse Arn at [facility]. [facility] has been unable to get in touch with patient to have her come to clinic for confirmatory testing and she has cancelled her future appointments with Dr. Irene Limbo.  Patient was in [facility] system due to RPR 1:1 on 01/02/21, but did not need follow up or interview as this was a biologically false positive (negative t pallidum antibody).  DIS will reach out to the patient, will offer her confirmatory testing and will follow up/refer as indicated from test results. Landis Gandy, RN

## 2021-05-24 ENCOUNTER — Other Ambulatory Visit: Payer: Self-pay

## 2021-05-24 ENCOUNTER — Ambulatory Visit (INDEPENDENT_AMBULATORY_CARE_PROVIDER_SITE_OTHER): Payer: Self-pay

## 2021-05-24 DIAGNOSIS — R21 Rash and other nonspecific skin eruption: Secondary | ICD-10-CM

## 2021-05-24 DIAGNOSIS — L988 Other specified disorders of the skin and subcutaneous tissue: Secondary | ICD-10-CM

## 2021-05-24 DIAGNOSIS — Z23 Encounter for immunization: Secondary | ICD-10-CM

## 2021-05-24 DIAGNOSIS — Z3042 Encounter for surveillance of injectable contraceptive: Secondary | ICD-10-CM

## 2021-05-24 MED ORDER — MEDROXYPROGESTERONE ACETATE 150 MG/ML IM SUSP
150.0000 mg | Freq: Once | INTRAMUSCULAR | Status: AC
  Administered 2021-05-24: 150 mg via INTRAMUSCULAR

## 2021-05-24 NOTE — Progress Notes (Signed)
Patient here today for Depo Provera injection and is within her dates.    Last contraceptive appt was 02/27/2021  Depo given in [place] today.  Site unremarkable & patient tolerated injection.    Next injection due 12/7-12/21.  Reminder card given.    Talbot Grumbling, RN

## 2021-05-25 ENCOUNTER — Ambulatory Visit: Payer: Medicaid Other | Admitting: Infectious Diseases

## 2021-05-25 ENCOUNTER — Encounter: Payer: Self-pay | Admitting: Student

## 2021-05-25 ENCOUNTER — Ambulatory Visit: Payer: Medicaid Other | Admitting: Pharmacist

## 2021-05-26 NOTE — Telephone Encounter (Signed)
Patient called to make an appointment at [facility] for 05/25/21, but no showed. DIS will follow up. Landis Gandy, RN

## 2021-06-07 ENCOUNTER — Ambulatory Visit (HOSPITAL_COMMUNITY)
Admission: RE | Admit: 2021-06-07 | Discharge: 2021-06-07 | Disposition: A | Discharge status: Home / Self Care | Payer: Medicaid Other | Source: Ambulatory Visit | Attending: Hematology | Admitting: Hematology

## 2021-06-07 DIAGNOSIS — C8409 Mycosis fungoides, extranodal and solid organ sites: Secondary | ICD-10-CM | POA: Insufficient documentation

## 2021-06-07 MED ORDER — IOHEXOL 350 MG/ML SOLN
80.0000 mL | Freq: Once | INTRAVENOUS | Status: AC | PRN
  Administered 2021-06-07: 80 mL via INTRAVENOUS

## 2021-06-15 ENCOUNTER — Other Ambulatory Visit: Payer: Self-pay

## 2021-06-15 ENCOUNTER — Telehealth: Payer: Self-pay

## 2021-06-15 DIAGNOSIS — C8409 Mycosis fungoides, extranodal and solid organ sites: Secondary | ICD-10-CM

## 2021-06-15 MED ORDER — PREDNISONE 5 MG PO TABS
ORAL_TABLET | ORAL | 0 refills | Status: AC
Start: 2021-06-15 — End: 2021-08-24

## 2021-06-15 NOTE — Telephone Encounter (Signed)
error 

## 2021-06-16 ENCOUNTER — Telehealth: Payer: Self-pay | Admitting: Hematology

## 2021-06-16 NOTE — Telephone Encounter (Signed)
Rescheduled per 10/13 in basket, message was left with pt

## 2021-06-19 ENCOUNTER — Other Ambulatory Visit: Payer: Self-pay | Admitting: Hematology

## 2021-06-19 DIAGNOSIS — C8409 Mycosis fungoides, extranodal and solid organ sites: Secondary | ICD-10-CM

## 2021-07-03 ENCOUNTER — Inpatient Hospital Stay: Payer: Medicaid Other | Attending: Hematology | Admitting: Hematology

## 2021-07-09 NOTE — Progress Notes (Signed)
This encounter was created in error - please disregard.

## 2021-07-10 ENCOUNTER — Ambulatory Visit (HOSPITAL_BASED_OUTPATIENT_CLINIC_OR_DEPARTMENT_OTHER): Payer: Medicaid Other | Admitting: Hematology

## 2021-07-10 ENCOUNTER — Other Ambulatory Visit: Payer: Self-pay

## 2021-07-10 ENCOUNTER — Other Ambulatory Visit: Payer: Self-pay | Admitting: Hematology

## 2021-07-10 ENCOUNTER — Ambulatory Visit: Payer: Medicaid Other

## 2021-07-10 DIAGNOSIS — Z21 Asymptomatic human immunodeficiency virus [HIV] infection status: Secondary | ICD-10-CM

## 2021-07-10 DIAGNOSIS — C8409 Mycosis fungoides, extranodal and solid organ sites: Secondary | ICD-10-CM

## 2021-07-10 DIAGNOSIS — N63 Unspecified lump in unspecified breast: Secondary | ICD-10-CM

## 2021-07-12 ENCOUNTER — Other Ambulatory Visit: Payer: Self-pay

## 2021-07-12 ENCOUNTER — Ambulatory Visit (INDEPENDENT_AMBULATORY_CARE_PROVIDER_SITE_OTHER): Payer: Self-pay | Admitting: Family Medicine

## 2021-07-12 VITALS — BP 117/81 | HR 114 | Wt 104.2 lb

## 2021-07-12 DIAGNOSIS — Z131 Encounter for screening for diabetes mellitus: Secondary | ICD-10-CM

## 2021-07-12 DIAGNOSIS — L0291 Cutaneous abscess, unspecified: Secondary | ICD-10-CM

## 2021-07-12 LAB — GLUCOSE, POCT (MANUAL RESULT ENTRY): POC Glucose: 90 mg/dl (ref 70–99)

## 2021-07-12 LAB — POCT GLYCOSYLATED HEMOGLOBIN (HGB A1C): HbA1c, POC (prediabetic range): 5.6 % — AB (ref 5.7–6.4)

## 2021-07-12 MED ORDER — DOXYCYCLINE HYCLATE 100 MG PO TABS: 100.0000 mg | ORAL_TABLET | Freq: Two times a day (BID) | ORAL | 0 refills | Status: DC

## 2021-07-12 NOTE — Patient Instructions (Addendum)
It was a pleasure to see you today!  Please take doxycycline 100 mg twice a day for 10 days Keep the area clean with soap and water once a day. Use warm compresses every hour today, and 4-5 times tomorrow at least.  If the incision closes up and you have increasing pressure, or red streaks spreading or temperature above 100.4*F, please return to care immediately Follow up in 1 week    Be Well,  Dr. Chauncey Reading

## 2021-07-13 DIAGNOSIS — L0291 Cutaneous abscess, unspecified: Secondary | ICD-10-CM | POA: Insufficient documentation

## 2021-07-13 NOTE — Assessment & Plan Note (Signed)
A1c 5.6%, no DM2 in setting of longterm prednisone.

## 2021-07-13 NOTE — Progress Notes (Signed)
    SUBJECTIVE:   CHIEF COMPLAINT / HPI: "boil"  Left inguinal abscess: patient presents with left inguinal abscess x 2 days. She reports that she previously had a boil appear in her right inguinal area after shaving her pubic hair last week. 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 Will test A1c today to look for DM2 given history of multiple "boils" and history of long term prednisone usage.  PERTINENT  PMH / PSH: mycosis fungoides  OBJECTIVE:   BP 117/81   Pulse (!) 114   Wt 104 lb 3.2 oz (47.3 kg)   SpO2 100%   BMI 18.46 kg/m   Nursing note and vitals reviewed GEN: age-appropriate, AAW, thin-appearing, appears in pain, NAD BREAST:  Symmetric breasts with no palpable breast masses or obvious breast lesions. She has no retractions or nipple discharge. She has no axillary abnormalities or palpable masses. PELVIC:  1.5cm x 3cm area of heat, fluctuance, and induration in left inguinal canal Ext: no edema Psych: Pleasant and appropriate    ASSESSMENT/PLAN:   Abscess Abscess of left inguinal canal, see procedure note below. Likely developed from folliculitis following shaving of hair in setting of mycosis fungoides and longterm prednisone usage. Will treat for MRSA coverage with doxycycline 100 mg BID x 10 d. Patient says she can take PCNs without problem and has used doxycylcine before. Patient was only able to tolerate small incision due to pain, which may close up as I was unable to make incision large enough to do packing or check for loculations. If incision closes and abscess reforms, patient may need referral to OBGYN for I&D under sedation to better tolerate complete drainage. Follow up 1 week, discussed return precautions.  Diagnosis: abscess - Location: left inguinal canal Procedure: Incision & drainage Informed consent:  Discussed risks (infection, pain, bleeding, bruising, numbness, and recurrence of the condition) and benefits of  the procedure, as well as the alternatives.  Informed consent was obtained. Anesthesia: 2.5cc lidocaine 1% without epi The area was prepared and draped in a standard fashion. The lesion drained pus. The patient could only tolerate very small incision due to pain. The patient was instructed on post-op care.   Mycosis fungoides (HCC) A1c 5.6%, no DM2 in setting of longterm prednisone.     Gladys Damme, MD [place]

## 2021-07-13 NOTE — Assessment & Plan Note (Signed)
Abscess of left inguinal canal, see procedure note below. Likely developed from folliculitis following shaving of hair in setting of mycosis fungoides and longterm prednisone usage. Will treat for MRSA coverage with doxycycline 100 mg BID x 10 d. Patient says she can take PCNs without problem and has used doxycylcine before. Patient was only able to tolerate small incision due to pain, which may close up as I was unable to make incision large enough to do packing or check for loculations. If incision closes and abscess reforms, patient may need referral to OBGYN for I&D under sedation to better tolerate complete drainage. Follow up 1 week, discussed return precautions.  Diagnosis: abscess - Location: left inguinal canal Procedure: Incision & drainage Informed consent:  Discussed risks (infection, pain, bleeding, bruising, numbness, and recurrence of the condition) and benefits of the procedure, as well as the alternatives.  Informed consent was obtained. Anesthesia: 2.5cc lidocaine 1% without epi The area was prepared and draped in a standard fashion. The lesion drained pus. The patient could only tolerate very small incision due to pain. The patient was instructed on post-op care.
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
## 2021-07-17 ENCOUNTER — Other Ambulatory Visit: Payer: Medicaid Other

## 2021-07-20 ENCOUNTER — Telehealth: Payer: Self-pay

## 2021-07-20 NOTE — Telephone Encounter (Signed)
New referral placed by Dr. Irene Limbo for HIV care. Patient never received confirmatory testing and no show'd appointments for testing. [facility] was previously contacted to assist in helping connect with patient. RN contacted Gwenlyn Perking with [facility] for further assistance in connecting with patient to get confirmatory testing to confirm HIV infection or false positive. Patient information faxed to Gwenlyn Perking who stated he would reach out to her tomorrow.   Knight Oelkers Lorita Officer, RN

## 2021-07-24 ENCOUNTER — Other Ambulatory Visit: Payer: Self-pay

## 2021-07-24 DIAGNOSIS — B20 Human immunodeficiency virus [HIV] disease: Secondary | ICD-10-CM

## 2021-07-24 NOTE — Telephone Encounter (Signed)
Patient is coming on Tuesday 11/29 for lab work; patient never completed confirmatory testing from previous attempts. Forwarding to provider to determine lab orders.  Heidie Krall Lorita Officer, RN

## 2021-08-01 ENCOUNTER — Ambulatory Visit (INDEPENDENT_AMBULATORY_CARE_PROVIDER_SITE_OTHER): Payer: Self-pay | Admitting: Family

## 2021-08-01 ENCOUNTER — Encounter: Payer: Self-pay | Admitting: Family

## 2021-08-01 ENCOUNTER — Other Ambulatory Visit: Payer: Medicaid Other

## 2021-08-01 ENCOUNTER — Other Ambulatory Visit: Payer: Self-pay

## 2021-08-01 VITALS — BP 106/72 | HR 90 | Temp 98.5°F | Ht 62.0 in | Wt 103.0 lb

## 2021-08-01 DIAGNOSIS — B20 Human immunodeficiency virus [HIV] disease: Secondary | ICD-10-CM

## 2021-08-01 DIAGNOSIS — Z21 Asymptomatic human immunodeficiency virus [HIV] infection status: Secondary | ICD-10-CM

## 2021-08-01 HISTORY — DX: Human immunodeficiency virus (HIV) disease: B20

## 2021-08-01 NOTE — Assessment & Plan Note (Signed)
Ashley Barber is a 40 y/o AA female recently diagnosed with mycosis fungoides and subsequently found to have initial positive HIV test. No confirmation testing results available. Risk factor for HIV is heterosexual contact. Briefly introduced HIV basics. Check HIV antibody and HIV RNA levels. Does not appear to have high risk. Will plan follow up and additional treatment as needed pending lab work results.

## 2021-08-01 NOTE — Patient Instructions (Signed)
Nice to meet you.  We will check your lab work today and let you know the results.   Follow up as needed pending lab work results.   Have a great day!

## 2021-08-01 NOTE — Progress Notes (Signed)
Subjective:    Patient ID: Ashley Barber, female    DOB: 1981/07/16, 40 y.o.   MRN: [id number]  Chief Complaint  Patient presents with   New Patient (Initial Visit)    HPI:  Ashley Barber is a 40 y.o. female with previous medical history of asthma and recent diagnosed of mycosis fungoides who presents today for a positive 4th generation HIV test.   [name] was seen by Oncology for newly diagnosed mycosis fungoides and lab work results revealed a positive 4th generation HIV test. Last negative HIV test was greater than 5 years ago. Has not been sexually active in the last 11 months. Denies any history of IV drug use although has smoked marijuana.. Denies fevers, chills, night sweats, headaches, changes in vision, neck pain/stiffness, nausea, diarrhea, vomiting, or lesions. Has been anxious since finding out the initial results and has needed time to process the potential of having HIV.  No Active Allergies    Outpatient Medications Prior to Visit  Medication Sig Dispense Refill   diclofenac Sodium (VOLTAREN) 1 % GEL Apply 2 g topically 4 (four) times daily. 50 g 3   famotidine (PEPCID) 20 MG tablet Take 1 tablet (20 mg total) by mouth daily. 90 tablet 3   loratadine (CLARITIN) 10 MG tablet Take 1 tablet (10 mg total) by mouth daily. 30 tablet 2   predniSONE (DELTASONE) 5 MG tablet Take 2 tablets (10 mg total) by mouth daily with breakfast for 14 days, THEN 1 tablet (5 mg total) daily with breakfast for 28 days, THEN 1 tablet (5 mg total) every other day for 28 days. 70 tablet 0   triamcinolone ointment (KENALOG) 0.1 % Apply 1 application topically 2 (two) times daily. 453.6 g 2   doxycycline (VIBRA-TABS) 100 MG tablet Take 1 tablet (100 mg total) by mouth 2 (two) times daily. (Patient not taking: Reported on 08/01/2021) 20 tablet 0   No facility-administered medications prior to visit.     Past Medical History:  Diagnosis Date   Asthma      Past Surgical History:   Procedure Laterality Date   HAND SURGERY         Review of Systems  Constitutional:  Negative for appetite change, chills, diaphoresis, fatigue, fever and unexpected weight change.  Eyes:        Negative for acute change in vision  Respiratory:  Negative for chest tightness, shortness of breath and wheezing.   Cardiovascular:  Negative for chest pain.  Gastrointestinal:  Negative for diarrhea, nausea and vomiting.  Genitourinary:  Negative for dysuria, pelvic pain and vaginal discharge.  Musculoskeletal:  Negative for neck pain and neck stiffness.  Skin:  Negative for rash.  Neurological:  Negative for seizures, syncope, weakness and headaches.  Hematological:  Negative for adenopathy. Does not bruise/bleed easily.  Psychiatric/Behavioral:  Negative for hallucinations.      Objective:    BP 106/72   Pulse 90   Temp 98.5 F (36.9 C) (Oral)   Ht 5\' 2"  (1.575 m)   Wt 103 lb (46.7 kg)   BMI 18.84 kg/m  Nursing note and vital signs reviewed.  Physical Exam Constitutional:      General: She is not in acute distress.    Appearance: She is well-developed.  Eyes:     Conjunctiva/sclera: Conjunctivae normal.  Cardiovascular:     Rate and Rhythm: Normal rate and regular rhythm.     Heart sounds: Normal heart sounds. No murmur heard.   No  friction rub. No gallop.  Pulmonary:     Effort: Pulmonary effort is normal. No respiratory distress.     Breath sounds: Normal breath sounds. No wheezing or rales.  Chest:     Chest wall: No tenderness.  Abdominal:     General: Bowel sounds are normal.     Palpations: Abdomen is soft.     Tenderness: There is no abdominal tenderness.  Musculoskeletal:     Cervical back: Neck supple.  Lymphadenopathy:     Cervical: No cervical adenopathy.  Skin:    General: Skin is warm and dry.     Findings: No rash.  Neurological:     Mental Status: She is alert and oriented to person, place, and time.  Psychiatric:        Behavior: Behavior  normal.        Thought Content: Thought content normal.        Judgment: Judgment normal.     Depression screen [facility] 2/9 08/01/2021 07/12/2021 02/27/2021 01/20/2021 01/09/2021  Decreased Interest 0 0 0 0 0  Down, Depressed, Hopeless 0 1 0 0 0  PHQ - 2 Score 0 1 0 0 0  Altered sleeping - 0 0 0 0  Tired, decreased energy - 0 0 0 0  Change in appetite - 0 0 0 0  Feeling bad or failure about yourself  - 0 0 1 0  Trouble concentrating - 0 0 0 0  Moving slowly or fidgety/restless - 0 0 0 0  Suicidal thoughts - 0 0 0 0  PHQ-9 Score - 1 0 1 0  Difficult doing work/chores - Not difficult at all - - Not difficult at all       Assessment & Plan:    Patient Active Problem List   Diagnosis Date Noted   HIV test positive ([place]) 08/01/2021   Abscess 07/13/2021   Hand injury, left, sequela 02/27/2021   COVID-19 vaccine administered 02/27/2021   Encounter for management and injection of depo-Provera 02/25/2021   Routine screening for STI (sexually transmitted infection) 02/02/2021   Cellulitis of left upper extremity 01/20/2021   Biological false positive RPR test 01/09/2021   Rash 01/04/2021   Mycosis fungoides ([place]) 08/11/2020     Problem List Items Addressed This Visit       Other   HIV test positive ([place]) - Primary    [name] is a 40 y/o AA female recently diagnosed with mycosis fungoides and subsequently found to have initial positive HIV test. No confirmation testing results available. Risk factor for HIV is heterosexual contact. Briefly introduced HIV basics. Check HIV antibody and HIV RNA levels. Does not appear to have high risk. Will plan follow up and additional treatment as needed pending lab work results.       Relevant Orders   HIV antibody (with reflex)   HIV 1 RNA quant-no reflex-bld     I have discontinued Ashley Barber. Ashley Barber's doxycycline. I am also having her maintain her loratadine, triamcinolone ointment, famotidine, diclofenac Sodium, and predniSONE.   Follow-up:  Pending lab work results as needed.    Ashley Piedra, MSN, FNP-C Nurse Practitioner [facility] for Infectious Disease [place] number: [phone]

## 2021-08-03 ENCOUNTER — Telehealth: Payer: Self-pay | Admitting: Family

## 2021-08-03 NOTE — Telephone Encounter (Signed)
I received the results of Ashley Barber's lab work showing a positive 4th generation HIV test with confirmatory viral load of 1.2 million. I discussed these results on the phone with her per her request and have her scheduled to come into the office on 08/04/21 to establish care and start medication. Emotional support provided.   Terri Piedra, NP 08/03/2021 4:43 PM

## 2021-08-04 ENCOUNTER — Ambulatory Visit (INDEPENDENT_AMBULATORY_CARE_PROVIDER_SITE_OTHER): Payer: Self-pay | Admitting: Family

## 2021-08-04 ENCOUNTER — Other Ambulatory Visit: Payer: Self-pay

## 2021-08-04 ENCOUNTER — Ambulatory Visit (INDEPENDENT_AMBULATORY_CARE_PROVIDER_SITE_OTHER): Payer: Self-pay | Admitting: Pharmacist

## 2021-08-04 ENCOUNTER — Other Ambulatory Visit: Payer: Self-pay | Admitting: Pharmacist

## 2021-08-04 ENCOUNTER — Encounter: Payer: Self-pay | Admitting: Family

## 2021-08-04 VITALS — BP 94/63 | HR 105 | Resp 16 | Ht 62.0 in | Wt 103.2 lb

## 2021-08-04 DIAGNOSIS — B20 Human immunodeficiency virus [HIV] disease: Secondary | ICD-10-CM

## 2021-08-04 LAB — HIV-1/2 AB - DIFFERENTIATION
HIV-1 antibody: POSITIVE — AB
HIV-2 Ab: NEGATIVE

## 2021-08-04 LAB — HIV-1 RNA QUANT-NO REFLEX-BLD
HIV 1 RNA Quant: 1210000 Copies/mL — ABNORMAL HIGH
HIV-1 RNA Quant, Log: 6.08 Log cps/mL — ABNORMAL HIGH

## 2021-08-04 LAB — HIV ANTIBODY (ROUTINE TESTING W REFLEX): HIV 1&2 Ab, 4th Generation: REACTIVE — AB

## 2021-08-04 MED ORDER — BIKTARVY 50-200-25 MG PO TABS: 1.0000 | ORAL_TABLET | Freq: Every day | ORAL | 0 refills | Status: DC

## 2021-08-04 MED ORDER — BICTEGRAVIR-EMTRICITAB-TENOFOV 50-200-25 MG PO TABS: 1.0000 | ORAL_TABLET | Freq: Every day | ORAL | 0 refills | Status: AC

## 2021-08-04 NOTE — Progress Notes (Signed)
Brief Narrative   Patient ID: Ashley Barber, female    DOB: 09/02/1981, 40 y.o.   MRN: [id number]  Ashley Barber is a 40 y/o AA female diagnosed with HIV disease on 08/01/21 with risk factor of heterosexual contact. Initial viral load of 1.21 million with CD4 count and genotype pending. No history of opportunistic infection. Sole ART regimen of Biktarvy to start.   Subjective:    Chief Complaint  Patient presents with   HIV Positive/AIDS    HPI:  Ashley Barber is a 40 y.o. female previously seen on 08/01/21 with concern for initial positive HIV testing completed additional testing with repeatedly positive 4th Generation test and positive HIV-1 antibody with HIV RNA level of 1.21 million. Here today to initiate care.   Ashley Barber was informed of her diagnosis yesterday and is doing okay. Does not want many details about HIV at this time and would like to discuss the next steps. As previously noted has not been sexually active in the past 11 months and her last HIV test was greater than 5 years ago. Currently feeling okay.  Denies fevers, chills, night sweats, headaches, changes in vision, neck pain/stiffness, nausea, diarrhea, vomiting, lesions or rashes. Has not informed anyone of her diagnosis and is currently with a partner who she is not sexually active yet.  No Active Allergies    Outpatient Medications Prior to Visit  Medication Sig Dispense Refill   diclofenac Sodium (VOLTAREN) 1 % GEL Apply 2 g topically 4 (four) times daily. 50 g 3   famotidine (PEPCID) 20 MG tablet Take 1 tablet (20 mg total) by mouth daily. 90 tablet 3   loratadine (CLARITIN) 10 MG tablet Take 1 tablet (10 mg total) by mouth daily. 30 tablet 2   predniSONE (DELTASONE) 5 MG tablet Take 2 tablets (10 mg total) by mouth daily with breakfast for 14 days, THEN 1 tablet (5 mg total) daily with breakfast for 28 days, THEN 1 tablet (5 mg total) every other day for 28 days. 70 tablet 0   triamcinolone ointment  (KENALOG) 0.1 % Apply 1 application topically 2 (two) times daily. 453.6 g 2   No facility-administered medications prior to visit.     Past Medical History:  Diagnosis Date   Asthma      Past Surgical History:  Procedure Laterality Date   HAND SURGERY        Review of Systems  Constitutional:  Negative for appetite change, chills, diaphoresis, fatigue, fever and unexpected weight change.  Eyes:        Negative for acute change in vision  Respiratory:  Negative for chest tightness, shortness of breath and wheezing.   Cardiovascular:  Negative for chest pain.  Gastrointestinal:  Negative for diarrhea, nausea and vomiting.  Genitourinary:  Negative for dysuria, pelvic pain and vaginal discharge.  Musculoskeletal:  Negative for neck pain and neck stiffness.  Skin:  Negative for rash.  Neurological:  Negative for seizures, syncope, weakness and headaches.  Hematological:  Negative for adenopathy. Does not bruise/bleed easily.  Psychiatric/Behavioral:  Negative for hallucinations.      Objective:    BP 94/63   Pulse (!) 105   Resp 16   Ht 5' 2"  (1.575 m)   Wt 103 lb 3.2 oz (46.8 kg)   SpO2 95%   BMI 18.88 kg/m  Nursing note and vital signs reviewed.  Physical Exam Constitutional:      General: She is not in acute distress.  Appearance: She is well-developed.  Eyes:     Conjunctiva/sclera: Conjunctivae normal.  Cardiovascular:     Rate and Rhythm: Normal rate and regular rhythm.     Heart sounds: Normal heart sounds. No murmur heard.   No friction rub. No gallop.  Pulmonary:     Effort: Pulmonary effort is normal. No respiratory distress.     Breath sounds: Normal breath sounds. No wheezing or rales.  Chest:     Chest wall: No tenderness.  Abdominal:     General: Bowel sounds are normal.     Palpations: Abdomen is soft.     Tenderness: There is no abdominal tenderness.  Musculoskeletal:     Cervical back: Neck supple.  Lymphadenopathy:     Cervical: No  cervical adenopathy.  Skin:    General: Skin is warm and dry.     Findings: No rash.  Neurological:     Mental Status: She is alert and oriented to person, place, and time.     Depression screen [facility] 2/9 08/04/2021 08/01/2021 07/12/2021 02/27/2021 01/20/2021  Decreased Interest 0 0 0 0 0  Down, Depressed, Hopeless 0 0 1 0 0  PHQ - 2 Score 0 0 1 0 0  Altered sleeping - - 0 0 0  Tired, decreased energy - - 0 0 0  Change in appetite - - 0 0 0  Feeling bad or failure about yourself  - - 0 0 1  Trouble concentrating - - 0 0 0  Moving slowly or fidgety/restless - - 0 0 0  Suicidal thoughts - - 0 0 0  PHQ-9 Score - - 1 0 1  Difficult doing work/chores - - Not difficult at all - -       Assessment & Plan:    Patient Active Problem List   Diagnosis Date Noted   HIV disease ([place]) 08/01/2021   Abscess 07/13/2021   Hand injury, left, sequela 02/27/2021   COVID-19 vaccine administered 02/27/2021   Encounter for management and injection of depo-Provera 02/25/2021   Routine screening for STI (sexually transmitted infection) 02/02/2021   Cellulitis of left upper extremity 01/20/2021   Biological false positive RPR test 01/09/2021   Rash 01/04/2021   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 Initial viral load of 1.21 million. No current signs/symptoms of opportunistic infection. Briefly introduced the basics of HIV including transmission and treatment. Complete initial clinic lab work. Will need [name] and [name] and met with Development worker, community. Met with pharmacy. Will start [place]. Samples provided and recorded in pharmacy log. Additional support resources and support provided. Plan for follow up in 1 month or sooner if needed.       Relevant Medications   bictegravir-emtricitabine-tenofovir  AF (BIKTARVY) 50-200-25 MG TABS tablet   Other Relevant Orders   CBC WITH DIFFERENTIAL/PLATELET   COMPLETE METABOLIC PANEL WITH GFR   RPR   URINALYSIS   LIPID PANEL   HIV-1 RNA ULTRAQUANT REFLEX TO GENTYP+   URINE CYTOLOGY ANCILLARY ONLY   HLA B*5701   QuantiFERON-TB Gold Plus   T-helper cells (CD4) count     I am having William Hamburger. Winfree start on [place]. I am also having her maintain her loratadine, triamcinolone ointment, famotidine, diclofenac Sodium, and predniSONE.  Meds ordered this encounter  Medications   bictegravir-emtricitabine-tenofovir AF (BIKTARVY) 50-200-25 MG TABS tablet    Sig: Take 1 tablet by mouth daily.    Dispense:  28 tablet    Refill:  0    Order Specific Question:   Supervising Provider    Answer:   Carlyle Basques [[id number]]      Follow-up: Return in about 1 month (around 09/04/2021), or if symptoms worsen or fail to improve.   Terri Piedra, MSN, FNP-C Nurse Practitioner [facility] for Infectious Disease [place] number: [phone]

## 2021-08-04 NOTE — Addendum Note (Signed)
Addended by: Caffie Pinto on: 08/04/2021 02:45 PM   Modules accepted: Orders

## 2021-08-04 NOTE — Progress Notes (Signed)
Medication Samples have been provided to the patient.  Drug name: Biktarvy        Strength: 50/200/25 mg       Qty: 28 tablets (4 bottles) LOT: [id number]   Exp.Date: 10/24  Dosing instructions: Take one tablet by mouth once daily  The patient has been instructed regarding the correct time, dose, and frequency of taking this medication, including desired effects and most common side effects.   Alfonse Spruce, PharmD, CPP Clinical Pharmacist Practitioner [facility] [place] for [facility]

## 2021-08-04 NOTE — Patient Instructions (Signed)
Nice to see you.  We will check your lab work today.  Start taking medication daily.   Plan for follow up in 1 month or sooner if needed.   Have a great weekend!

## 2021-08-04 NOTE — Assessment & Plan Note (Signed)
Ashley Barber is a 40 y/o AA female newly diagnosed with HIV-1 disease with risk factor of heterosexual contact. Initial viral load of 1.21 million. No current signs/symptoms of opportunistic infection. Briefly introduced the basics of HIV including transmission and treatment. Complete initial clinic lab work. Will need [name] and [name] and met with Development worker, community. Met with pharmacy. Will start [place]. Samples provided and recorded in pharmacy log. Additional support resources and support provided. Plan for follow up in 1 month or sooner if needed.

## 2021-08-04 NOTE — Progress Notes (Signed)
HPI: Ashley Barber is a 40 y.o. female who presents to the [facility] clinic today to initiate care for a newly diagnosed HIV infection.  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   FAMOTIDINE (PEPCID) 20 MG TABLET    Take 1 tablet (20 mg total) by mouth daily.   LORATADINE (CLARITIN) 10 MG TABLET    Take 1 tablet (10 mg total) by mouth daily.   PREDNISONE (DELTASONE) 5 MG TABLET    Take 2 tablets (10 mg total) by mouth daily with breakfast for 14 days, THEN 1 tablet (5 mg total) daily with breakfast for 28 days, THEN 1 tablet (5 mg total) every other day for 28 days.   TRIAMCINOLONE OINTMENT (KENALOG) 0.1 %    Apply 1 application topically 2 (two) times daily.  Modified Medications   No medications on file  Discontinued Medications   No medications on file    Allergies: No Active Allergies  Past Medical History: Past Medical History:  Diagnosis Date   Asthma     Social History: Social History   Socioeconomic History   Marital status: Single    Spouse name: Not on file   Number of children: Not on file   Years of education: Not on file   Highest education level: Not on file  Occupational History   Not on file  Tobacco Use   Smoking status: Every Day    Packs/day: 0.25    Types: Cigarettes   Smokeless tobacco: Current   Tobacco comments:    Slowing down  due to cancer and is now smoking 1-2 cig daily   Substance and Sexual Activity   Alcohol use: Yes    Comment: occasionally - wine   Drug use: Yes    Frequency: 4.0 times per week    Types: Marijuana    Comment: as needed   Sexual activity: Not Currently    Birth control/protection: Abstinence    Comment: 12 months no sexual activity  Other Topics Concern   Not on file  Social History Narrative   Not on file   Social Determinants of Health   Financial Resource Strain: Not on file  Food Insecurity: Not on file  Transportation Needs: Not on file  Physical Activity: Not on file  Stress: Not on file  Social Connections: Not on file    Labs: Lab Results  Component Value Date   HIV1RNAQUANT 1,210,000 (H) 08/01/2021    RPR and STI Lab Results  Component Value Date   LABRPR Reactive (A) 01/02/2021    STI Results GC CT  09/22/2019 Negative Negative    Hepatitis B Lab Results  Component Value Date   HEPBSAG NON REACTIVE 03/17/2021   HEPBCAB NON REACTIVE 03/17/2021   Hepatitis C No results found for: HEPCAB, HCVRNAPCRQN Hepatitis A No results found for: HAV Lipids: No results found for: CHOL,  TRIG, HDL, CHOLHDL, VLDL, LDLCALC  Current HIV Regimen: Treatment naive  Assessment: Ashley Barber is here today to initiate care with Marya Amsler for her newly diagnosed HIV infection.  She is treatment naive with an initial HIV viral load of 1.21 million. Will start patient on [place].  Explained that [place] is a one pill once daily medication with or without food and the importance of not missing any doses. Explained resistance and how it develops and why it is so important to take Biktarvy daily and not skip days or doses. Counseled patient to take it around the same time each day. Counseled on what to do if dose is missed, if closer to missed dose take immediately, if closer to next dose then skip and resume normal schedule. Cautioned on possible side effects the first week or so  including nausea, diarrhea, dizziness, and headaches but that they should resolve after the first couple of weeks. I reviewed patient medications and found no drug interactions. Counseled patient to separate Biktarvy from divalent cations including multivitamins. Discussed with patient to call clinic if she starts a new medication or herbal supplement. I gave the patient my card and told her to call me with any issues/questions/concerns.  Plan: Start [place]  Counseled patient on [place], PharmD, CPP Clinical Pharmacist Practitioner Infectious Diseases [place] for Infectious Disease 08/04/2021, 12:02 PM

## 2021-08-05 LAB — URINALYSIS
Bilirubin Urine: NEGATIVE
Glucose, UA: NEGATIVE
Ketones, ur: NEGATIVE
Nitrite: NEGATIVE
Specific Gravity, Urine: 1.011 (ref 1.001–1.035)
pH: 6.5 (ref 5.0–8.0)

## 2021-08-07 ENCOUNTER — Encounter: Payer: Self-pay | Admitting: Family

## 2021-08-07 LAB — URINE CYTOLOGY ANCILLARY ONLY
Chlamydia: NEGATIVE
Comment: NEGATIVE
Comment: NORMAL
Neisseria Gonorrhea: NEGATIVE

## 2021-08-09 ENCOUNTER — Other Ambulatory Visit: Payer: Self-pay

## 2021-08-09 ENCOUNTER — Other Ambulatory Visit: Payer: Medicaid Other

## 2021-08-09 DIAGNOSIS — B20 Human immunodeficiency virus [HIV] disease: Secondary | ICD-10-CM

## 2021-08-09 MED ORDER — BIKTARVY 50-200-25 MG PO TABS: 1.0000 | ORAL_TABLET | Freq: Every day | ORAL | 0 refills | Status: DC

## 2021-08-09 NOTE — Telephone Encounter (Signed)
Biktarvy was ordered as sample, sent refill to [facility] per pharmacy's request.   Beryle Flock, RN

## 2021-08-11 ENCOUNTER — Other Ambulatory Visit: Payer: Self-pay

## 2021-08-11 DIAGNOSIS — C8409 Mycosis fungoides, extranodal and solid organ sites: Secondary | ICD-10-CM

## 2021-08-13 LAB — QUANTIFERON-TB GOLD PLUS
Mitogen-NIL: 1.38 IU/mL
NIL: 0.06 IU/mL
QuantiFERON-TB Gold Plus: NEGATIVE
TB1-NIL: 0.01 IU/mL
TB2-NIL: 0 IU/mL

## 2021-08-13 LAB — COMPLETE METABOLIC PANEL WITH GFR
AG Ratio: 0.4 (calc) — ABNORMAL LOW (ref 1.0–2.5)
ALT: 8 U/L (ref 6–29)
AST: 24 U/L (ref 10–30)
Albumin: 2.8 g/dL — ABNORMAL LOW (ref 3.6–5.1)
Alkaline phosphatase (APISO): 61 U/L (ref 31–125)
BUN/Creatinine Ratio: 6 (calc) (ref 6–22)
BUN: 3 mg/dL — ABNORMAL LOW (ref 7–25)
CO2: 28 mmol/L (ref 20–32)
Calcium: 8.5 mg/dL — ABNORMAL LOW (ref 8.6–10.2)
Chloride: 103 mmol/L (ref 98–110)
Creat: 0.54 mg/dL (ref 0.50–0.97)
Globulin: 6.7 g/dL (calc) — ABNORMAL HIGH (ref 1.9–3.7)
Glucose, Bld: 69 mg/dL (ref 65–99)
Potassium: 3.6 mmol/L (ref 3.5–5.3)
Sodium: 134 mmol/L — ABNORMAL LOW (ref 135–146)
Total Bilirubin: 0.2 mg/dL (ref 0.2–1.2)
Total Protein: 9.5 g/dL — ABNORMAL HIGH (ref 6.1–8.1)
eGFR: 120 mL/min/{1.73_m2} (ref >=60)

## 2021-08-13 LAB — LIPID PANEL
Cholesterol: 134 mg/dL (ref <200)
HDL: 27 mg/dL — ABNORMAL LOW (ref >=50)
LDL Cholesterol (Calc): 84 mg/dL (calc)
Non-HDL Cholesterol (Calc): 107 mg/dL (calc) (ref <130)
Total CHOL/HDL Ratio: 5 (calc) — ABNORMAL HIGH (ref <5.0)
Triglycerides: 129 mg/dL (ref <150)

## 2021-08-13 LAB — CBC WITH DIFFERENTIAL/PLATELET
Absolute Monocytes: 416 cells/uL (ref 200–950)
Basophils Absolute: 21 cells/uL (ref 0–200)
Basophils Relative: 0.4 %
Eosinophils Absolute: 31 cells/uL (ref 15–500)
Eosinophils Relative: 0.6 %
HCT: 36.4 % (ref 35.0–45.0)
Hemoglobin: 12 g/dL (ref 11.7–15.5)
Lymphs Abs: 1898 cells/uL (ref 850–3900)
MCH: 31.5 pg (ref 27.0–33.0)
MCHC: 33 g/dL (ref 32.0–36.0)
MCV: 95.5 fL (ref 80.0–100.0)
MPV: 10 fL (ref 7.5–12.5)
Monocytes Relative: 8 %
Neutro Abs: 2834 cells/uL (ref 1500–7800)
Neutrophils Relative %: 54.5 %
Platelets: 255 10*3/uL (ref 140–400)
RBC: 3.81 10*6/uL (ref 3.80–5.10)
RDW: 13.4 % (ref 11.0–15.0)
Total Lymphocyte: 36.5 %
WBC: 5.2 10*3/uL (ref 3.8–10.8)

## 2021-08-13 LAB — T-HELPER CELLS (CD4) COUNT (NOT AT ARMC)
Absolute CD4: 235 cells/uL — ABNORMAL LOW (ref 490–1740)
CD4 T Helper %: 13 % — ABNORMAL LOW (ref 30–61)
Total lymphocyte count: 1832 cells/uL (ref 850–3900)

## 2021-08-13 LAB — RPR: RPR Ser Ql: NONREACTIVE

## 2021-08-13 LAB — HIV-1 RNA ULTRAQUANT REFLEX TO GENTYP+
HIV 1 RNA Quant: 1100000 copies/mL — ABNORMAL HIGH
HIV-1 RNA Quant, Log: 6.04 Log copies/mL — ABNORMAL HIGH

## 2021-08-13 LAB — HIV-1 GENOTYPE: HIV-1 Genotype: DETECTED — AB

## 2021-08-13 LAB — HLA B*5701: HLA-B*5701 w/rflx HLA-B High: NEGATIVE

## 2021-08-14 ENCOUNTER — Inpatient Hospital Stay: Payer: Medicaid Other

## 2021-08-14 ENCOUNTER — Inpatient Hospital Stay: Payer: Medicaid Other | Attending: Hematology | Admitting: Hematology

## 2021-08-29 ENCOUNTER — Telehealth: Payer: Self-pay

## 2021-08-29 ENCOUNTER — Other Ambulatory Visit: Payer: Self-pay | Admitting: Family

## 2021-08-29 DIAGNOSIS — B20 Human immunodeficiency virus [HIV] disease: Secondary | ICD-10-CM

## 2021-08-29 NOTE — Telephone Encounter (Signed)
Received refill request for patient's Biktarvy. Per [facility], she has not filled the Rx that was sent on 12/7. Patient needs to call [phone] to set up delivery.   Called patient to let her know, no answer and voicemail box full.  Beryle Flock, RN

## 2021-08-29 NOTE — Telephone Encounter (Signed)
Patient returned call, states she picked up her [place] yesterday. Additional medication can be refilled at upcoming appointment.  Beryle Flock, RN

## 2021-09-05 ENCOUNTER — Ambulatory Visit: Payer: Medicaid Other | Admitting: Family

## 2021-09-07 ENCOUNTER — Ambulatory Visit (INDEPENDENT_AMBULATORY_CARE_PROVIDER_SITE_OTHER): Payer: Self-pay | Admitting: Family

## 2021-09-07 ENCOUNTER — Other Ambulatory Visit: Payer: Self-pay

## 2021-09-07 ENCOUNTER — Ambulatory Visit: Payer: Self-pay

## 2021-09-07 ENCOUNTER — Encounter: Payer: Self-pay | Admitting: Family

## 2021-09-07 VITALS — BP 116/80 | HR 117 | Temp 98.6°F | Resp 16 | Wt 106.0 lb

## 2021-09-07 DIAGNOSIS — Z0000 Encounter for general adult medical examination without abnormal findings: Secondary | ICD-10-CM | POA: Insufficient documentation

## 2021-09-07 DIAGNOSIS — B20 Human immunodeficiency virus [HIV] disease: Secondary | ICD-10-CM

## 2021-09-07 MED ORDER — BIKTARVY 50-200-25 MG PO TABS: 1.0000 | ORAL_TABLET | Freq: Every day | ORAL | 5 refills | Status: DC

## 2021-09-07 NOTE — Progress Notes (Signed)
Brief Narrative   Patient ID: Ashley Barber, female    DOB: Jun 10, 1981, 41 y.o.   MRN: [id number]  Ashley Barber is a 41 y/o AA female diagnosed with HIV disease on 08/01/21 with risk factor of heterosexual contact. Initial viral load of 1.21 million with CD4 count of 235. Genotype with no significant medication resistance mutations. [id number] negative. No history of opportunistic infection. Enters care in CDC Stage 2. Sole ART regimen of Biktarvy to start.  Subjective:    Chief Complaint  Patient presents with   Follow-up    B20      HPI:  Ashley Barber is a 41 y.o. female with HIV disease last seen for initial visit on 08/04/21 with newly diagnosed HIV and found to have CD4 count of 235 and viral load of 1.1. million. Genotype with no significant medication resistance mutations. [id number] negative. Hepatitis B and Hepatitis C were negative. Quantiferon Gold negative. Started on [place] and here today for follow up office visit.  Ashley Barber has been taking her Biktarvy daily as prescribed with 1 missed doses since her last office visit. Overall feeling well today with no new concerns/complaints. She has an upcoming birthday. Has gained weight since her last visit. Denies fevers, chills, night sweats, headaches, changes in vision, neck pain/stiffness, nausea, diarrhea, vomiting, lesions or rashes.  Ashley Barber has been approved for UMAP.  Denies feelings of being down, depressed or hopeless recently. Drinks alcohol and uses marijuana on occasion with tobacco use daily. Condoms offered.  Declines vaccines.    No Known Allergies    Outpatient Medications Prior to Visit  Medication Sig Dispense Refill   diclofenac Sodium (VOLTAREN) 1 % GEL Apply 2 g topically 4 (four) times daily. 50 g 3   famotidine (PEPCID) 20 MG tablet Take 1 tablet (20 mg total) by mouth daily. 90 tablet 3   loratadine (CLARITIN) 10 MG tablet Take 1 tablet (10 mg total) by mouth daily. 30 tablet 2   triamcinolone  ointment (KENALOG) 0.1 % Apply 1 application topically 2 (two) times daily. 453.6 g 2   bictegravir-emtricitabine-tenofovir AF (BIKTARVY) 50-200-25 MG TABS tablet Take 1 tablet by mouth daily. 30 tablet 0   No facility-administered medications prior to visit.     Past Medical History:  Diagnosis Date   Asthma      Past Surgical History:  Procedure Laterality Date   HAND SURGERY        Review of Systems  Constitutional:  Negative for appetite change, chills, diaphoresis, fatigue, fever and unexpected weight change.  Eyes:        Negative for acute change in vision  Respiratory:  Negative for cough, chest tightness, shortness of breath and wheezing.   Cardiovascular:  Negative for chest pain.  Gastrointestinal:  Negative for abdominal pain, diarrhea, nausea and vomiting.  Genitourinary:  Negative for dysuria, pelvic pain and vaginal discharge.  Musculoskeletal:  Negative for neck pain and neck stiffness.  Skin:  Negative for rash.  Neurological:  Negative for seizures, syncope, weakness and headaches.  Hematological:  Negative for adenopathy. Does not bruise/bleed easily.  Psychiatric/Behavioral:  Negative for hallucinations.      Objective:    BP 116/80    Pulse (!) 117    Temp 98.6 F (37 C) (Temporal)    Resp 16    Wt 106 lb (48.1 kg)    SpO2 99%    BMI 19.39 kg/m  Nursing note and vital signs reviewed.  Physical Exam Constitutional:  General: She is not in acute distress.    Appearance: She is well-developed.  Eyes:     Conjunctiva/sclera: Conjunctivae normal.  Cardiovascular:     Rate and Rhythm: Normal rate and regular rhythm.     Heart sounds: Normal heart sounds. No murmur heard.   No friction rub. No gallop.  Pulmonary:     Effort: Pulmonary effort is normal. No respiratory distress.     Breath sounds: Normal breath sounds. No wheezing or rales.  Chest:     Chest wall: No tenderness.  Abdominal:     General: Bowel sounds are normal.     Palpations:  Abdomen is soft.     Tenderness: There is no abdominal tenderness.  Musculoskeletal:     Cervical back: Neck supple.  Lymphadenopathy:     Cervical: No cervical adenopathy.  Skin:    General: Skin is warm and dry.     Findings: No rash.  Neurological:     Mental Status: She is alert and oriented to person, place, and time.  Psychiatric:        Behavior: Behavior normal.        Thought Content: Thought content normal.        Judgment: Judgment normal.     Depression screen [facility] 2/9 08/04/2021 08/01/2021 07/12/2021 02/27/2021 01/20/2021  Decreased Interest 0 0 0 0 0  Down, Depressed, Hopeless 0 0 1 0 0  PHQ - 2 Score 0 0 1 0 0  Altered sleeping - - 0 0 0  Tired, decreased energy - - 0 0 0  Change in appetite - - 0 0 0  Feeling bad or failure about yourself  - - 0 0 1  Trouble concentrating - - 0 0 0  Moving slowly or fidgety/restless - - 0 0 0  Suicidal thoughts - - 0 0 0  PHQ-9 Score - - 1 0 1  Difficult doing work/chores - - Not difficult at all - -       Assessment & Plan:    Patient Active Problem List   Diagnosis Date Noted   Healthcare maintenance 09/07/2021   HIV disease ([place]) 08/01/2021   Abscess 07/13/2021   Hand injury, left, sequela 02/27/2021   COVID-19 vaccine administered 02/27/2021   Encounter for management and injection of depo-Provera 02/25/2021   Routine screening for STI (sexually transmitted infection) 02/02/2021   Cellulitis of left upper extremity 01/20/2021   Biological false positive RPR test 01/09/2021   Rash 01/04/2021   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 No signs/symptoms of opportunistic infection. We reviewed previous lab work and discussed  The concept of undetectable equaling untransmitable as well as what to  do if she misses a dose of medication. Check lab work today. Continue current dose of Biktarvy. Renew financial assistance. Plan for follow up in 1 month or sooner if needed with lab work on the same day.       Relevant Medications   bictegravir-emtricitabine-tenofovir AF (BIKTARVY) 50-200-25 MG TABS tablet   Other Relevant Orders   HIV-1 RNA quant-no reflex-bld   T-helper cell (CD4)- ([facility] clinic only)   Healthcare maintenance    Discussed importance of safe sexual practice and condom usage. Condoms  offered. Declines vaccines.         I am having William Hamburger. [place] maintain her loratadine, triamcinolone ointment, famotidine, diclofenac Sodium, and Biktarvy.   Meds ordered this encounter  Medications   bictegravir-emtricitabine-tenofovir AF (BIKTARVY) 50-200-25 MG TABS tablet    Sig: Take 1 tablet by mouth daily.    Dispense:  30 tablet    Refill:  5    Order Specific Question:   Supervising Provider    Answer:   Carlyle Basques [[id number]]     Follow-up: Return in about 1 month (around 10/08/2021), or if symptoms worsen or fail to improve.   Terri Piedra, MSN, FNP-C Nurse Practitioner [facility] for Infectious Disease [place] number: [phone]

## 2021-09-07 NOTE — Patient Instructions (Signed)
Nice to see you.  We will check your lab work today.  Continue to take your medication daily as prescribed.  Refills have been sent to the pharmacy.  Plan for follow up in 1 month or sooner if needed with lab work on the same day.   Have a great day and stay safe!  

## 2021-09-07 NOTE — Assessment & Plan Note (Signed)
·   Discussed importance of safe sexual practice and condom usage. Condoms offered.  Declines vaccines.

## 2021-09-07 NOTE — Assessment & Plan Note (Signed)
Ashley Barber appears to be doing well following her first month of medication with good adherence and tolerance to her ART regimen of Biktarvy. No signs/symptoms of opportunistic infection. We reviewed previous lab work and discussed  The concept of undetectable equaling untransmitable as well as what to do if she misses a dose of medication. Check lab work today. Continue current dose of Biktarvy. Renew financial assistance. Plan for follow up in 1 month or sooner if needed with lab work on the same day.

## 2021-09-08 LAB — T-HELPER CELL (CD4) - (RCID CLINIC ONLY)
CD4 % Helper T Cell: 13 % — ABNORMAL LOW (ref 33–65)
CD4 T Cell Abs: 260 /uL — ABNORMAL LOW (ref 400–1790)

## 2021-09-09 LAB — HIV-1 RNA QUANT-NO REFLEX-BLD
HIV 1 RNA Quant: 155 Copies/mL — ABNORMAL HIGH
HIV-1 RNA Quant, Log: 2.19 Log cps/mL — ABNORMAL HIGH

## 2021-09-18 ENCOUNTER — Telehealth: Payer: Self-pay | Admitting: Hematology

## 2021-09-18 NOTE — Telephone Encounter (Signed)
Scheduled appointment per 1/16 scheduling message. Unable to leave voicemail due to mailbox being full. Patient will be mailed an updated calendar.

## 2021-10-09 ENCOUNTER — Other Ambulatory Visit: Payer: Self-pay

## 2021-10-09 ENCOUNTER — Inpatient Hospital Stay: Payer: BLUE CROSS/BLUE SHIELD | Attending: Hematology | Admitting: Hematology

## 2021-10-09 VITALS — BP 123/92 | HR 108 | Temp 98.1°F | Resp 18 | Wt 108.6 lb

## 2021-10-09 DIAGNOSIS — B20 Human immunodeficiency virus [HIV] disease: Secondary | ICD-10-CM | POA: Diagnosis present

## 2021-10-09 DIAGNOSIS — C8409 Mycosis fungoides, extranodal and solid organ sites: Secondary | ICD-10-CM | POA: Diagnosis present

## 2021-10-09 DIAGNOSIS — F17210 Nicotine dependence, cigarettes, uncomplicated: Secondary | ICD-10-CM | POA: Insufficient documentation

## 2021-10-09 MED ORDER — DOXYCYCLINE HYCLATE 100 MG PO TABS: 100.0000 mg | ORAL_TABLET | Freq: Two times a day (BID) | ORAL | 0 refills | Status: DC

## 2021-10-10 ENCOUNTER — Telehealth: Payer: Self-pay | Admitting: Hematology

## 2021-10-10 NOTE — Telephone Encounter (Signed)
Scheduled follow-up appointment per 2/6 los. Patient is aware. °
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
## 2021-10-25 ENCOUNTER — Encounter: Payer: Self-pay | Admitting: Family

## 2021-10-25 ENCOUNTER — Other Ambulatory Visit: Payer: Self-pay

## 2021-10-25 ENCOUNTER — Ambulatory Visit (INDEPENDENT_AMBULATORY_CARE_PROVIDER_SITE_OTHER): Payer: BLUE CROSS/BLUE SHIELD | Admitting: Family

## 2021-10-25 DIAGNOSIS — B20 Human immunodeficiency virus [HIV] disease: Secondary | ICD-10-CM

## 2021-10-25 MED ORDER — DOVATO 50-300 MG PO TABS: 1.0000 | ORAL_TABLET | Freq: Every day | ORAL | 0 refills | Status: DC

## 2021-10-25 MED ORDER — HYDROXYZINE HCL 25 MG PO TABS: 25.0000 mg | ORAL_TABLET | Freq: Three times a day (TID) | ORAL | 1 refills | Status: DC | PRN
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
## 2021-10-25 NOTE — Patient Instructions (Signed)
Nice to see you.  Start taking to take your medication daily as prescribed.  Plan for follow up in 1 months or sooner if needed with lab work on the same day.  Have a great day and stay safe!

## 2021-10-25 NOTE — Assessment & Plan Note (Signed)
Ashley Barber has been off medication for approximately 3 weeks.  No signs/symptoms of opportunistic infection.  It is unlikely the rash/skin eruption is related to medication.  It is also less likely to be related to any immune reconstitution as her CD4 count was greater than 200 and would have anticipated resolution after 1 to 2 weeks.  We will change medication to Dovato.  She has follow-up with dermatology.  We will start hydroxyzine as needed for itching.  Plan for follow-up in 1 month or sooner if needed with lab work on the same day.

## 2021-10-27 ENCOUNTER — Other Ambulatory Visit: Payer: Self-pay | Admitting: Pharmacist

## 2021-10-27 DIAGNOSIS — B20 Human immunodeficiency virus [HIV] disease: Secondary | ICD-10-CM

## 2021-10-27 MED ORDER — DOVATO 50-300 MG PO TABS: 1.0000 | ORAL_TABLET | Freq: Every day | ORAL | 0 refills | Status: AC

## 2021-10-27 NOTE — Progress Notes (Signed)
Medication Samples have been provided to the patient.  Drug name: Dovato        Strength: 50/300 mg         Qty: 28  Tablets (2 bottles) LOT: [id number]   Exp.Date: 6/24  Dosing instructions: Take one tablet by mouth once daily  The patient has been instructed regarding the correct time, dose, and frequency of taking this medication, including desired effects and most common side effects.   Alfonse Spruce, PharmD, CPP Clinical Pharmacist Practitioner [facility] [place] [facility]

## 2021-11-01 ENCOUNTER — Telehealth: Payer: BLUE CROSS/BLUE SHIELD | Admitting: Emergency Medicine

## 2021-11-01 DIAGNOSIS — R21 Rash and other nonspecific skin eruption: Secondary | ICD-10-CM | POA: Diagnosis not present

## 2021-11-01 NOTE — Progress Notes (Signed)
?Virtual Visit Consent  ? ?Ashley Barber, you are scheduled for a virtual visit with a [facility] provider today.   ?  ?Just as with appointments in the office, your consent must be obtained to participate.  Your consent will be active for this visit and any virtual visit you may have with one of our providers in the next 365 days.   ?  ?If you have a MyChart account, a copy of this consent can be sent to you electronically.  All virtual visits are billed to your insurance company just like a traditional visit in the office.   ? ?As this is a virtual visit, video technology does not allow for your provider to perform a traditional examination.  This may limit your provider's ability to fully assess your condition.  If your provider identifies any concerns that need to be evaluated in person or the need to arrange testing (such as labs, EKG, etc.), we will make arrangements to do so.   ?  ?Although advances in technology are sophisticated, we cannot ensure that it will always work on either your end or our end.  If the connection with a video visit is poor, the visit may have to be switched to a telephone visit.  With either a video or telephone visit, we are not always able to ensure that we have a secure connection.    ? ?I need to obtain your verbal consent now.   Are you willing to proceed with your visit today?  ?  ?Ashley Barber has provided verbal consent on 11/01/2021 for a virtual visit (video or telephone). ?  ?Carvel Getting, NP  ? ?Date: 11/01/2021 3:23 PM ? ? ?Virtual Visit via Video Note  ? ?Ashley Barber, connected with  Ashley Barber  ([id number], July 15, 1981) on 11/01/21 at  3:15 PM EST by a video-enabled telemedicine application and verified that I am speaking with the correct person using two identifiers. ? ?Location: ?Patient: Virtual Visit Location Patient: Other: her parked car ?Provider: Virtual Visit Location Provider: Home Office ?  ?I discussed the limitations of evaluation and  management by telemedicine and the availability of in person appointments. The patient expressed understanding and agreed to proceed.   ? ?History of Present Illness: ?Ashley Barber is a 41 y.o. who identifies as a female who was assigned female at birth, and is being seen today for facial rash and itching.  Patient has had trouble with this rash for about a month.  She has seen medical oncology and infectious disease for this problem.  She has been referred to dermatology and has appointment around March 26.  Oncology prescribed doxycycline which did not relieve her symptoms at all.  Infectious disease prescribed hydroxyzine for itching but she has not yet filled the prescription.  She needs treatment for mycosis fungoides however oncology notes indicate rash on face thought less likely to be mycosis fungoides and more likely to be folliculitis or molluscum contagiosum.  She denies angioedema, shortness of breath, throat swelling, difficulty breathing, wheezing. ? ?HPI: HPI  ?Problems:  ?Patient Active Problem List  ? Diagnosis Date Noted  ? Healthcare maintenance 09/07/2021  ? HIV disease ([place]) 08/01/2021  ? Abscess 07/13/2021  ? Hand injury, left, sequela 02/27/2021  ? COVID-19 vaccine administered 02/27/2021  ? Encounter for management and injection of depo-Provera 02/25/2021  ? Routine screening for STI (sexually transmitted infection) 02/02/2021  ? Cellulitis of left upper extremity 01/20/2021  ? 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 Rash 01/04/2021  ? 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  ?Allergies: No Known Allergies ?Medications:  ?Current Outpatient Medications:  ?  diclofenac Sodium (VOLTAREN) 1 % GEL, Apply 2 g topically 4 (four) times daily. (Patient not taking: Reported on 10/25/2021), Disp: 50 g, Rfl: 3 ?  dolutegravir-lamiVUDine (DOVATO) 50-300 MG tablet, Take 1 tablet by mouth daily., Disp: 28 tablet, Rfl: 0 ?  dolutegravir-lamiVUDine (DOVATO) 50-300 MG tablet, Take 1 tablet by mouth  daily for 28 days., Disp: 28 tablet, Rfl: 0 ?  doxycycline (VIBRA-TABS) 100 MG tablet, Take 1 tablet (100 mg total) by mouth 2 (two) times daily. (Patient not taking: Reported on 10/25/2021), Disp: 10 tablet, Rfl: 0 ?  famotidine (PEPCID) 20 MG tablet, Take 1 tablet (20 mg total) by mouth daily. (Patient not taking: Reported on 10/25/2021), Disp: 90 tablet, Rfl: 3 ?  hydrOXYzine (ATARAX) 25 MG tablet, Take 1 tablet (25 mg total) by mouth 3 (three) times daily as needed for itching., Disp: 45 tablet, Rfl: 1 ?  loratadine (CLARITIN) 10 MG tablet, Take 1 tablet (10 mg total) by mouth daily. (Patient not taking: Reported on 10/25/2021), Disp: 30 tablet, Rfl: 2 ?  triamcinolone ointment (KENALOG) 0.1 %, Apply 1 application topically 2 (two) times daily., Disp: 453.6 g, Rfl: 2 ? ?Observations/Objective: ?Patient is well-developed, well-nourished in no acute distress.  ?Resting comfortably in her car.  ?Head is normocephalic, atraumatic.  ?No labored breathing.  ?Speech is clear and coherent with logical content.  ?Patient is alert and oriented at baseline.  ?Patient is scratching her face. ? ?Assessment and Plan: ?1. Facial rash ? ?We discussed her current plan of care.  I advised her to get her hydroxyzine filled and start taking it.  I advised her to keep her appointment with dermatology March 26.  If she continues to have severe symptoms I advised her to try calling oncology or infectious disease for further help until her dermatology appointment.  We discussed that since she does not have any emergency/red flag symptoms, the ER is not likely to be able to help treat her. ? ?Follow Up Instructions: ?I discussed the assessment and treatment plan with the patient. The patient was provided an opportunity to ask questions and all were answered. The patient agreed with the plan and demonstrated an understanding of the instructions.  A copy of instructions were sent to the patient via MyChart unless otherwise noted below.   ? ? ?The patient was advised to call back or seek an in-person evaluation if the symptoms worsen or if the condition fails to improve as anticipated. ? ?Time:  ?I spent 10 minutes with the patient via telehealth technology discussing the above problems/concerns.   ? ?Carvel Getting, NP ?

## 2021-11-01 NOTE — Patient Instructions (Signed)
?  Lockie Mola, thank you for joining Carvel Getting, NP for today's virtual visit.  While this provider is not your primary care provider (PCP), if your PCP is located in our provider database this encounter information will be shared with them immediately following your visit. ? ?Consent: ?(Patient) Ashley Barber provided verbal consent for this virtual visit at the beginning of the encounter. ? ?Current Medications: ? ?Current Outpatient Medications:  ?  diclofenac Sodium (VOLTAREN) 1 % GEL, Apply 2 g topically 4 (four) times daily. (Patient not taking: Reported on 10/25/2021), Disp: 50 g, Rfl: 3 ?  dolutegravir-lamiVUDine (DOVATO) 50-300 MG tablet, Take 1 tablet by mouth daily., Disp: 28 tablet, Rfl: 0 ?  dolutegravir-lamiVUDine (DOVATO) 50-300 MG tablet, Take 1 tablet by mouth daily for 28 days., Disp: 28 tablet, Rfl: 0 ?  doxycycline (VIBRA-TABS) 100 MG tablet, Take 1 tablet (100 mg total) by mouth 2 (two) times daily. (Patient not taking: Reported on 10/25/2021), Disp: 10 tablet, Rfl: 0 ?  famotidine (PEPCID) 20 MG tablet, Take 1 tablet (20 mg total) by mouth daily. (Patient not taking: Reported on 10/25/2021), Disp: 90 tablet, Rfl: 3 ?  hydrOXYzine (ATARAX) 25 MG tablet, Take 1 tablet (25 mg total) by mouth 3 (three) times daily as needed for itching., Disp: 45 tablet, Rfl: 1 ?  loratadine (CLARITIN) 10 MG tablet, Take 1 tablet (10 mg total) by mouth daily. (Patient not taking: Reported on 10/25/2021), Disp: 30 tablet, Rfl: 2 ?  triamcinolone ointment (KENALOG) 0.1 %, Apply 1 application topically 2 (two) times daily., Disp: 453.6 g, Rfl: 2  ? ?Medications ordered in this encounter:  ?No orders of the defined types were placed in this encounter. ?  ? ?*If you need refills on other medications prior to your next appointment, please contact your pharmacy* ? ?Follow-Up: ?Call back or seek an in-person evaluation if the symptoms worsen or if the condition fails to improve as anticipated. ? ?Other  Instructions ?Please get the hydroxyzine prescription filled and see if it helps control your itching.  ? ?Keep your dermatology appointment later in March as scheduled.  ? ?If you continue having severe symptoms, try calling Dr. Grier Mitts office for help until you can get to see dermatology.  ? ? ?If you have been instructed to have an in-person evaluation today at a local Urgent Care facility, please use the link below. It will take you to a list of all of our available [facility], including address, phone number and hours of operation. Please do not delay care.  ?[facility] ? ?If you or a family member do not have a primary care provider, use the link below to schedule a visit and establish care. When you choose a [facility] primary care physician or advanced practice provider, you gain a long-term partner in health. ?Find a Primary Care Provider ? ?Learn more about 's in-office and virtual care options: ?[facility]  ?

## 2021-11-08 ENCOUNTER — Telehealth: Payer: BLUE CROSS/BLUE SHIELD | Admitting: Physician Assistant

## 2021-11-08 DIAGNOSIS — L731 Pseudofolliculitis barbae: Secondary | ICD-10-CM | POA: Diagnosis not present

## 2021-11-08 MED ORDER — DOXYCYCLINE HYCLATE 100 MG PO TABS
100.0000 mg | ORAL_TABLET | Freq: Two times a day (BID) | ORAL | 0 refills | Status: DC
Start: 2021-11-08 — End: 2022-01-24

## 2021-11-08 NOTE — Progress Notes (Signed)
?Virtual Visit Consent  ? ?Lockie Mola, you are scheduled for a virtual visit with a [facility] provider today.   ?  ?Just as with appointments in the office, your consent must be obtained to participate.  Your consent will be active for this visit and any virtual visit you may have with one of our providers in the next 365 days.   ?  ?If you have a MyChart account, a copy of this consent can be sent to you electronically.  All virtual visits are billed to your insurance company just like a traditional visit in the office.   ? ?As this is a virtual visit, video technology does not allow for your provider to perform a traditional examination.  This may limit your provider's ability to fully assess your condition.  If your provider identifies any concerns that need to be evaluated in person or the need to arrange testing (such as labs, EKG, etc.), we will make arrangements to do so.   ?  ?Although advances in technology are sophisticated, we cannot ensure that it will always work on either your end or our end.  If the connection with a video visit is poor, the visit may have to be switched to a telephone visit.  With either a video or telephone visit, we are not always able to ensure that we have a secure connection.    ? ?I need to obtain your verbal consent now.   Are you willing to proceed with your visit today?  ?  ?Ashley Barber has provided verbal consent on 11/08/2021 for a virtual visit (video or telephone). ?  ?Mar Daring, PA-C  ? ?Date: 11/08/2021 3:09 PM ? ? ?Virtual Visit via Video Note  ? ?Ashley Barber, connected with  Ashley Barber  ([id number], 07-Jul-1981) on 11/08/21 at  4:00 PM EST by a video-enabled telemedicine application and verified that I am speaking with the correct person using two identifiers. ? ?Location: ?Patient: Virtual Visit Location Patient: Home ?Provider: Virtual Visit Location Provider: Home Office ?  ?I discussed the limitations of evaluation and management  by telemedicine and the availability of in person appointments. The patient expressed understanding and agreed to proceed.   ? ?History of Present Illness: ?Ashley Barber is a 41 y.o. who identifies as a female who was assigned female at birth, and is being seen today for possible skin infection or ingrown hair. Occurred after shaving. Left upper thigh has an area that is red, swollen, warm and tender to touch. No drainage at this time. Has been using boil ease for pain relief.  ? ?  ?Problems:  ?Patient Active Problem List  ? Diagnosis Date Noted  ? Healthcare maintenance 09/07/2021  ? HIV disease ([place]) 08/01/2021  ? Abscess 07/13/2021  ? Hand injury, left, sequela 02/27/2021  ? COVID-19 vaccine administered 02/27/2021  ? Encounter for management and injection of depo-Provera 02/25/2021  ? Routine screening for STI (sexually transmitted infection) 02/02/2021  ? Cellulitis of left upper extremity 01/20/2021  ? Biological false positive RPR test 01/09/2021  ? Rash 01/04/2021  ? 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  ?Allergies: No Known Allergies ?Medications:  ?Current Outpatient Medications:  ?  doxycycline (VIBRA-TABS) 100 MG tablet, Take 1 tablet (100 mg total) by mouth 2 (two) times daily., Disp: 20 tablet, Rfl: 0 ?  diclofenac Sodium (VOLTAREN) 1 % GEL, Apply 2 g topically 4 (four) times daily. (Patient not taking: Reported on 10/25/2021), Disp: 50 g,  Rfl: 3 ?  dolutegravir-lamiVUDine (DOVATO) 50-300 MG tablet, Take 1 tablet by mouth daily., Disp: 28 tablet, Rfl: 0 ?  dolutegravir-lamiVUDine (DOVATO) 50-300 MG tablet, Take 1 tablet by mouth daily for 28 days., Disp: 28 tablet, Rfl: 0 ?  famotidine (PEPCID) 20 MG tablet, Take 1 tablet (20 mg total) by mouth daily. (Patient not taking: Reported on 10/25/2021), Disp: 90 tablet, Rfl: 3 ?  hydrOXYzine (ATARAX) 25 MG tablet, Take 1 tablet (25 mg total) by mouth 3 (three) times daily as needed for itching., Disp: 45 tablet, Rfl: 1 ?  loratadine (CLARITIN) 10 MG  tablet, Take 1 tablet (10 mg total) by mouth daily. (Patient not taking: Reported on 10/25/2021), Disp: 30 tablet, Rfl: 2 ?  triamcinolone ointment (KENALOG) 0.1 %, Apply 1 application topically 2 (two) times daily., Disp: 453.6 g, Rfl: 2 ? ?Observations/Objective: ?Patient is well-developed, well-nourished in no acute distress.  ?Resting comfortably at home.  ?Head is normocephalic, atraumatic.  ?No labored breathing.  ?Speech is clear and coherent with logical content.  ?Patient is alert and oriented at baseline.  ?Left upper thigh has an area that is swollen, erythematous, and appears indurated when patient touches it on video ? ?Assessment and Plan: ?1. Ingrown hair ?- doxycycline (VIBRA-TABS) 100 MG tablet; Take 1 tablet (100 mg total) by mouth 2 (two) times daily.  Dispense: 20 tablet; Refill: 0 ? ?- Suspect ingrown hair ?- Doxycycline prescribed ?- Warm compresses ?- Epsom salt soaks ?- Exfoliate ?- May require in person evaluation if symptoms persist or fail to improve ? ?Follow Up Instructions: ?I discussed the assessment and treatment plan with the patient. The patient was provided an opportunity to ask questions and all were answered. The patient agreed with the plan and demonstrated an understanding of the instructions.  A copy of instructions were sent to the patient via MyChart unless otherwise noted below.  ? ?The patient was advised to call back or seek an in-person evaluation if the symptoms worsen or if the condition fails to improve as anticipated. ? ?Time:  ?I spent 10 minutes with the patient via telehealth technology discussing the above problems/concerns.   ? ?Mar Daring, PA-C ?

## 2021-11-08 NOTE — Patient Instructions (Signed)
?Lockie Mola, thank you for joining Mar Daring, PA-C for today's virtual visit.  While this provider is not your primary care provider (PCP), if your PCP is located in our provider database this encounter information will be shared with them immediately following your visit. ? ?Consent: ?(Patient) Ashley Barber provided verbal consent for this virtual visit at the beginning of the encounter. ? ?Current Medications: ? ?Current Outpatient Medications:  ?  doxycycline (VIBRA-TABS) 100 MG tablet, Take 1 tablet (100 mg total) by mouth 2 (two) times daily., Disp: 20 tablet, Rfl: 0 ?  diclofenac Sodium (VOLTAREN) 1 % GEL, Apply 2 g topically 4 (four) times daily. (Patient not taking: Reported on 10/25/2021), Disp: 50 g, Rfl: 3 ?  dolutegravir-lamiVUDine (DOVATO) 50-300 MG tablet, Take 1 tablet by mouth daily., Disp: 28 tablet, Rfl: 0 ?  dolutegravir-lamiVUDine (DOVATO) 50-300 MG tablet, Take 1 tablet by mouth daily for 28 days., Disp: 28 tablet, Rfl: 0 ?  famotidine (PEPCID) 20 MG tablet, Take 1 tablet (20 mg total) by mouth daily. (Patient not taking: Reported on 10/25/2021), Disp: 90 tablet, Rfl: 3 ?  hydrOXYzine (ATARAX) 25 MG tablet, Take 1 tablet (25 mg total) by mouth 3 (three) times daily as needed for itching., Disp: 45 tablet, Rfl: 1 ?  loratadine (CLARITIN) 10 MG tablet, Take 1 tablet (10 mg total) by mouth daily. (Patient not taking: Reported on 10/25/2021), Disp: 30 tablet, Rfl: 2 ?  triamcinolone ointment (KENALOG) 0.1 %, Apply 1 application topically 2 (two) times daily., Disp: 453.6 g, Rfl: 2  ? ?Medications ordered in this encounter:  ?Meds ordered this encounter  ?Medications  ? doxycycline (VIBRA-TABS) 100 MG tablet  ?  Sig: Take 1 tablet (100 mg total) by mouth 2 (two) times daily.  ?  Dispense:  20 tablet  ?  Refill:  0  ?  Order Specific Question:   Supervising Provider  ?  Answer:   Noemi Chapel [[id number]]  ?  ? ?*If you need refills on other medications prior to your next appointment,  please contact your pharmacy* ? ?Follow-Up: ?Call back or seek an in-person evaluation if the symptoms worsen or if the condition fails to improve as anticipated. ? ?Other Instructions ?Ingrown Hair ?An ingrown hair is a hair that curls and re-enters the skin instead of growing straight out of the skin. An ingrown hair can develop in any part of the skin that hair is removed from. An ingrown hair may cause small pockets of infection. ?What are the causes? ?An ingrown hair may be caused by: ?Shaving. ?Tweezing. ?Waxing. ?Using a hair removal cream. ?What increases the risk? ?You are more likely to develop this condition if you have curly hair. ?What are the signs or symptoms? ?Symptoms of an ingrown hair may include: ?Small bumps on the skin. The bumps may be filled with pus. ?Pain. ?Itching. ?How is this diagnosed? ?An ingrown hair is diagnosed by a skin exam. ?How is this treated? ?Treatment is often not needed unless the ingrown hair has caused an infection. If needed, treatment may include: ?Applying prescription creams to the skin. This can help with inflammation. ?Applying warm compresses to the skin. This can help soften the skin. ?Taking antibiotic medicine. An antibiotic may be prescribed if the infection is severe. ?Retracting and releasing the ingrown hair tips. ?Follow these instructions at home: ?Medicines ?Take, apply, or use over-the-counter and prescription medicines only as told by your health care provider. This includes any prescription creams. ?If you were prescribed  an antibiotic medicine, take it as told by your health care provider. Do not stop using the antibiotic even if you start to feel better. ?General instructions ?Do not shave irritated areas of skin. You may start shaving these areas again once the irritation has gone away. ?To help remove ingrown hairs on your face, you may use a facial sponge in a gentle circular motion. ?Do not pick or squeeze the irritated area of skin as this may  cause infection and scarring. ?Managing pain and swelling ?If directed, apply heat to the affected area as often as told by your health care provider. Use the heat source that your health care provider recommends, such as a moist heat pack or a heating pad. ?Place a towel between your skin and the heat source. ?Leave the heat on for 20-30 minutes. ?Remove the heat if your skin turns bright red. This is especially important if you are unable to feel pain, heat, or cold. You may have a greater risk of getting burned. ?How is this prevented? ?Shower before shaving. ?Wrap areas that you are going to shave in warm, moist wraps for several minutes before shaving. The warmth and moisture help to soften the hairs and makes ingrown hairs less likely. ?Use thick shaving gels. ?Use a razor that cuts hair slightly above your skin, or use an electric shaver with a long shave setting. ?Shave in the direction of hair growth. ?Avoid making multiple razor strokes. ?Apply a moisturizing lotion after shaving. ?Summary ?An ingrown hair is a hair that curls and re-enters the skin instead of growing straight out of the skin. ?Treatment is often not needed unless the ingrown hair has caused an infection. ?Take, apply, or use over-the-counter and prescription medicines only as told by your health care provider. This includes any prescription creams. ?Do not shave irritated areas of skin. You may start shaving these areas again once the irritation has gone away. ?If directed, apply heat to the affected area. Use the heat source that your health care provider recommends, such as a moist heat pack or a heating pad. ?This information is not intended to replace advice given to you by your health care provider. Make sure you discuss any questions you have with your health care provider. ?Document Revised: 02/26/2018 Document Reviewed: 02/26/2018 ?Elsevier Patient Education ? 2022 [place]. ? ? ? ?If you have been instructed to have an  in-person evaluation today at a local Urgent Care facility, please use the link below. It will take you to a list of all of our available [facility], including address, phone number and hours of operation. Please do not delay care.  ?[facility] ? ?If you or a family member do not have a primary care provider, use the link below to schedule a visit and establish care. When you choose a [facility] primary care physician or advanced practice provider, you gain a long-term partner in health. ?Find a Primary Care Provider ? ?Learn more about [facility] in-office and virtual care options: ?[facility] ?

## 2021-11-22 ENCOUNTER — Ambulatory Visit: Payer: BLUE CROSS/BLUE SHIELD | Admitting: Family

## 2021-11-27 ENCOUNTER — Other Ambulatory Visit: Payer: Self-pay

## 2021-11-27 ENCOUNTER — Ambulatory Visit: Payer: BLUE CROSS/BLUE SHIELD | Admitting: Family

## 2021-11-27 MED ORDER — DOVATO 50-300 MG PO TABS: 1.0000 | ORAL_TABLET | Freq: Every day | ORAL | 0 refills | Status: DC

## 2021-11-30 ENCOUNTER — Ambulatory Visit (INDEPENDENT_AMBULATORY_CARE_PROVIDER_SITE_OTHER): Payer: BLUE CROSS/BLUE SHIELD | Admitting: Family

## 2021-11-30 ENCOUNTER — Other Ambulatory Visit: Payer: Self-pay

## 2021-11-30 ENCOUNTER — Encounter: Payer: Self-pay | Admitting: Family

## 2021-11-30 VITALS — BP 105/74 | HR 73 | Temp 97.8°F | Wt 111.0 lb

## 2021-11-30 DIAGNOSIS — B20 Human immunodeficiency virus [HIV] disease: Secondary | ICD-10-CM | POA: Diagnosis not present

## 2021-11-30 DIAGNOSIS — R21 Rash and other nonspecific skin eruption: Secondary | ICD-10-CM

## 2021-11-30 MED ORDER — DOVATO 50-300 MG PO TABS: 1.0000 | ORAL_TABLET | Freq: Every day | ORAL | 4 refills | Status: DC

## 2021-11-30 NOTE — Patient Instructions (Addendum)
Nice to see you. ? ?We will check your lab work today. ? ?Continue to take your medication daily as prescribed. ? ?I will check with Dr. Irene Limbo about potential for prednisone.  ? ?Refills have been sent to the pharmacy. ? ?Plan for follow up in 3 months or sooner if needed with lab work on the same day. ? ?Have a great day and stay safe! ? ?

## 2021-11-30 NOTE — Assessment & Plan Note (Signed)
Ashley Barber continues to have a rash of unclear origin although does not appear to be infectious and unlikely medication related. Had previous rash that cleared up with prednisone and was coming off prednisone prior to initiation of HIV medications. Question autoimmune component given improvements and re-emergence. Awaiting dermatology appointment. Question steroid course would clear once again and will reach out to Oncology.  ?

## 2021-11-30 NOTE — Assessment & Plan Note (Signed)
Ms. Mansouri appears to have well controlled virus with good adherence and tolerance to her ART regimen of Dovato. Reviewed previous lab work and discussed plan of care. Check blood work today. Plan for follow up in 3 months or sooner if needed with lab work on the same day.  ?

## 2021-11-30 NOTE — Progress Notes (Signed)
? ? ?Brief Narrative  ? ?Patient ID: Ashley Barber, female    DOB: [birth year], 41 y.o.   MRN: [id number] ? ?Ashley Barber is a 41 y/o AA female diagnosed with HIV disease on 08/01/21 with risk factor of heterosexual contact. Initial viral load of 1.21 million with CD4 count of 235. Genotype with no significant medication resistance mutations. [id number] negative. No history of opportunistic infection. Enters care in [facility] Stage 2. Sole ART regimen of Biktarvy to start ? ?Subjective:  ?  ?Chief Complaint  ?Patient presents with  ? Follow-up  ?  Facial itching, falls   ? ? ?HPI: ? ?Ashley Barber is a 41 y.o. 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 Continued to have facial rash and was changed to Dovato in the event it was medication related. Here today for routine follow up. ? ?Ashley Barber has been taking the Dovato daily as prescribed with no adverse side effects. Continues to have the facial rash and now on her back and right arm. Previously on prednisone which helped to clear her skin from a similar rash prior to her diagnosis with HIV. Otherwise feeling well today. ? ?Ashley Barber has no problems obtaining medications from the pharmacy. Denies feelings of being down, depressed or hopeless. Continues to drink alcohol and smoke marijuna on occasion with daily tobacco use. Condoms offered. Awaiting appointment with dermatology for further evaluation.  ? ? ?No Known Allergies ? ? ? ?Outpatient Medications Prior to Visit  ?Medication Sig Dispense Refill  ? doxycycline (VIBRA-TABS) 100 MG tablet Take 1 tablet (100 mg total) by mouth 2 (two) times daily. 20 tablet 0  ? hydrOXYzine (ATARAX) 25 MG tablet Take 1 tablet (25 mg total) by mouth 3 (three) times daily as needed for itching. 45 tablet 1  ? triamcinolone ointment (KENALOG) 0.1 % Apply 1 application topically 2 (two) times daily. 453.6 g 2  ? dolutegravir-lamiVUDine (DOVATO) 50-300 MG tablet  Take 1 tablet by mouth daily. 30 tablet 0  ? diclofenac Sodium (VOLTAREN) 1 % GEL Apply 2 g topically 4 (four) times daily. (Patient not taking: Reported on 10/25/2021) 50 g 3  ? famotidine (PEPCID) 20 MG tablet Take 1 tablet (20 mg total) by mouth daily. (Patient not taking: Reported on 10/25/2021) 90 tablet 3  ? loratadine (CLARITIN) 10 MG tablet Take 1 tablet (10 mg total) by mouth daily. (Patient not taking: Reported on 10/25/2021) 30 tablet 2  ? ?No facility-administered medications prior to visit.  ? ? ? ?Past Medical History:  ?Diagnosis Date  ? Asthma   ? ? ? ?Past Surgical History:  ?Procedure Laterality Date  ? HAND SURGERY    ? ? ? ? ?Review of Systems  ?Constitutional:  Negative for appetite change, chills, diaphoresis, fatigue, fever and unexpected weight change.  ?Eyes:   ?     Negative for acute change in vision  ?Respiratory:  Negative for chest tightness, shortness of breath and wheezing.   ?Cardiovascular:  Negative for chest pain.  ?Gastrointestinal:  Negative for diarrhea, nausea and vomiting.  ?Genitourinary:  Negative for dysuria, pelvic pain and vaginal discharge.  ?Musculoskeletal:  Negative for neck pain and neck stiffness.  ?Skin:  Positive for rash.  ?Neurological:  Negative for seizures, syncope, weakness and headaches.  ?Hematological:  Negative for adenopathy. Does not bruise/bleed easily.  ?Psychiatric/Behavioral:  Negative for hallucinations.   ?   ?Objective:  ?  ?BP 105/74  Pulse 73   Temp 97.8 ?F (36.6 ?C) (Oral)   Wt 111 lb (50.3 kg)   SpO2 97%   BMI 19.98 kg/m?  ?Nursing note and vital signs reviewed. ? ?Physical Exam ?Constitutional:   ?   General: She is not in acute distress. ?   Appearance: She is well-developed.  ?Cardiovascular:  ?   Rate and Rhythm: Normal rate and regular rhythm.  ?   Heart sounds: Normal heart sounds.  ?Pulmonary:  ?   Effort: Pulmonary effort is normal.  ?   Breath sounds: Normal breath sounds.  ?Skin: ?   General: Skin is warm and dry.  ?    Findings: Rash present.  ?Neurological:  ?   Mental Status: She is alert and oriented to person, place, and time.  ?Psychiatric:     ?   Behavior: Behavior normal.     ?   Thought Content: Thought content normal.     ?   Judgment: Judgment normal.  ? ? ? ? ?  10/25/2021  ?  4:01 PM 08/04/2021  ? 10:32 AM 08/01/2021  ?  9:10 AM 07/12/2021  ?  3:06 PM 02/27/2021  ? 10:31 AM  ?Depression screen PHQ 2/9  ?Decreased Interest 0 0 0 0 0  ?Down, Depressed, Hopeless 0 0 0 1 0  ?PHQ - 2 Score 0 0 0 1 0  ?Altered sleeping    0 0  ?Tired, decreased energy    0 0  ?Change in appetite    0 0  ?Feeling bad or failure about yourself     0 0  ?Trouble concentrating    0 0  ?Moving slowly or fidgety/restless    0 0  ?Suicidal thoughts    0 0  ?PHQ-9 Score    1 0  ?Difficult doing work/chores    Not difficult at all   ?  ?   ?Assessment & Plan:  ? ? ?Patient Active Problem List  ? Diagnosis Date Noted  ? Healthcare maintenance 09/07/2021  ? HIV disease ([place]) 08/01/2021  ? Abscess 07/13/2021  ? Hand injury, left, sequela 02/27/2021  ? COVID-19 vaccine administered 02/27/2021  ? Encounter for management and injection of depo-Provera 02/25/2021  ? Routine screening for STI (sexually transmitted infection) 02/02/2021  ? Cellulitis of left upper extremity 01/20/2021  ? Biological false positive RPR test 01/09/2021  ? Rash 01/04/2021  ? 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 ? ? ?Problem List Items Addressed This Visit   ? ?  ? Musculoskeletal and Integument  ? Rash  ?  Ashley Barber continues to have a rash of unclear origin although does not appear to be infectious and unlikely medication related. Had previous rash that cleared up with prednisone and was coming off prednisone prior to initiation of HIV medications. Question autoimmune component given improvements and re-emergence. Awaiting dermatology appointment. Question steroid course would clear once again and will reach out to Oncology.  ?  ?  ?  ? Other  ? HIV disease ([place]) - Primary  ?  Ashley.  Barber appears to have well controlled virus with good adherence and tolerance to her ART regimen of Dovato. Reviewed previous lab work and discussed plan of care. Check blood work today. Plan for follow up in 3 months or sooner if needed with lab work on the same day.  ?  ?  ? Relevant Medications  ? dolutegravir-lamiVUDine (DOVATO) 50-300 MG tablet  ? Other Relevant Orders  ? T-helper cell (CD4)- (  [facility] clinic only)  ? HIV-1 RNA quant-no reflex-bld  ? ? ? ?I am having William Hamburger. Klutts maintain her loratadine, triamcinolone ointment, famotidine, diclofenac Sodium, hydrOXYzine, doxycycline, and Dovato. ? ? ?Meds ordered this encounter  ?Medications  ? dolutegravir-lamiVUDine (DOVATO) 50-300 MG tablet  ?  Sig: Take 1 tablet by mouth daily.  ?  Dispense:  30 tablet  ?  Refill:  4  ?  Order Specific Question:   Supervising Provider  ?  Answer:   Carlyle Basques [[id number]]  ? ? ? ?Follow-up: Return in about 3 months (around 03/02/2022), or if symptoms worsen or fail to improve. ? ? ?Terri Piedra, MSN, FNP-C ?Nurse Practitioner ?[place] [facility] ?[facility] ?[facility] Main number: [phone] ? ? ?

## 2021-12-03 LAB — T-HELPER CELL (CD4) - (RCID CLINIC ONLY)
CD4 % Helper T Cell: 17 % — ABNORMAL LOW (ref 33–65)
CD4 T Cell Abs: 256 /uL — ABNORMAL LOW (ref 400–1790)

## 2021-12-04 LAB — HIV-1 RNA QUANT-NO REFLEX-BLD
HIV 1 RNA Quant: <20 Copies/mL — ABNORMAL HIGH
HIV-1 RNA Quant, Log: <1.3 Log cps/mL — ABNORMAL HIGH

## 2021-12-12 ENCOUNTER — Telehealth: Payer: BLUE CROSS/BLUE SHIELD | Admitting: Physician Assistant

## 2021-12-12 DIAGNOSIS — L299 Pruritus, unspecified: Secondary | ICD-10-CM

## 2021-12-12 DIAGNOSIS — Z8579 Personal history of other malignant neoplasms of lymphoid, hematopoietic and related tissues: Secondary | ICD-10-CM | POA: Diagnosis not present

## 2021-12-12 MED ORDER — CETIRIZINE HCL 10 MG PO TABS: 10.0000 mg | ORAL_TABLET | Freq: Every day | ORAL | 11 refills | Status: DC

## 2021-12-12 NOTE — Patient Instructions (Addendum)
?  Lockie Mola, thank you for joining Rodney Booze, PA-C for today's virtual visit.  While this provider is not your primary care provider (PCP), if your PCP is located in our provider database this encounter information will be shared with them immediately following your visit. ? ?Consent: ?(Patient) Ashley Barber provided verbal consent for this virtual visit at the beginning of the encounter. ? ?Current Medications: ? ?Current Outpatient Medications:  ?  cetirizine (ZYRTEC) 10 MG tablet, Take 1 tablet (10 mg total) by mouth daily., Disp: 30 tablet, Rfl: 11 ?  diclofenac Sodium (VOLTAREN) 1 % GEL, Apply 2 g topically 4 (four) times daily. (Patient not taking: Reported on 10/25/2021), Disp: 50 g, Rfl: 3 ?  dolutegravir-lamiVUDine (DOVATO) 50-300 MG tablet, Take 1 tablet by mouth daily., Disp: 30 tablet, Rfl: 4 ?  doxycycline (VIBRA-TABS) 100 MG tablet, Take 1 tablet (100 mg total) by mouth 2 (two) times daily., Disp: 20 tablet, Rfl: 0 ?  famotidine (PEPCID) 20 MG tablet, Take 1 tablet (20 mg total) by mouth daily. (Patient not taking: Reported on 10/25/2021), Disp: 90 tablet, Rfl: 3 ?  hydrOXYzine (ATARAX) 25 MG tablet, Take 1 tablet (25 mg total) by mouth 3 (three) times daily as needed for itching., Disp: 45 tablet, Rfl: 1 ?  triamcinolone ointment (KENALOG) 0.1 %, Apply 1 application topically 2 (two) times daily., Disp: 453.6 g, Rfl: 2  ? ?Medications ordered in this encounter:  ?Meds ordered this encounter  ?Medications  ? cetirizine (ZYRTEC) 10 MG tablet  ?  Sig: Take 1 tablet (10 mg total) by mouth daily.  ?  Dispense:  30 tablet  ?  Refill:  11  ?  Order Specific Question:   Supervising Provider  ?  Answer:   Noemi Chapel [[id number]]  ?  ? ?*If you need refills on other medications prior to your next appointment, please contact your pharmacy* ? ?Follow-Up: ?Call back or seek an in-person evaluation if the symptoms worsen or if the condition fails to improve as anticipated. ? ?Other Instructions ?Please  take the zyrtec as recommended instead of benadryl.  ? ?Please call your oncologist to further evaluation and treatment of your symptoms. ? ? ?If you have been instructed to have an in-person evaluation today at a local Urgent Care facility, please use the link below. It will take you to a list of all of our available [facility], including address, phone number and hours of operation. Please do not delay care.  ?[facility] ? ?If you or a family member do not have a primary care provider, use the link below to schedule a visit and establish care. When you choose a [facility] primary care physician or advanced practice provider, you gain a long-term partner in health. ?Find a Primary Care Provider ? ?Learn more about 's in-office and virtual care options: ?[facility] ? ?

## 2021-12-12 NOTE — Progress Notes (Signed)
Ms. Ashley Barber are scheduled for a virtual visit with your provider today.   ? ?Just as we do with appointments in the office, we must obtain your consent to participate.  Your consent will be active for this visit and any virtual visit you may have with one of our providers in the next 365 days.   ? ?If you have a MyChart account, I can also send a copy of this consent to you electronically.  All virtual visits are billed to your insurance company just like a traditional visit in the office.  As this is a virtual visit, video technology does not allow for your provider to perform a traditional examination.  This may limit your provider's ability to fully assess your condition.  If your provider identifies any concerns that need to be evaluated in person or the need to arrange testing such as labs, EKG, etc, we will make arrangements to do so.   ? ?Although advances in technology are sophisticated, we cannot ensure that it will always work on either your end or our end.  If the connection with a video visit is poor, we may have to switch to a telephone visit.  With either a video or telephone visit, we are not always able to ensure that we have a secure connection.   I need to obtain your verbal consent now.   Are you willing to proceed with your visit today?  ? ?Ashley Barber has provided verbal consent on 12/12/2021 for a virtual visit (video or telephone). ? ? ?Ashley Barber, Ashley Barber ?12/12/2021  9:29 AM ? ? ?Date:  12/12/2021  ? ?ID:  Ashley Barber, DOB 1980-12-09, MRN [id number] ? ?Patient Location: Home ?Provider Location: Home Office ? ? ?Participants: Patient and Provider for Visit and Wrap up ? ?Method of visit: Video  ?Location of Patient: Home ?Location of Provider: Home Office ?Consent was obtain for visit over the video. ?Services rendered by provider: Visit was performed via video ? ?A video enabled telemedicine application was used and I verified that I am speaking with the correct person using two  identifiers. ? ?PCP:  [name], MD  ? ?Chief Complaint:  rash ? ?History of Present Illness:   ? ?Ashley Barber is a 41 y.o. female with history as stated below. ?Presents video telehealth for an acute care visit ? ?Pt c/o a rash that is pruritic in nature. It is located to her face, arms and back. The rash started 1 month ago.  She has a h/o eczema and has used her steroid cream and benadryl with no relief. ? ?Hx of mycosis fungoides. Follows with ID and oncology. Has been referred to [facility] but is unable to f/u yet due to transportation issues. States oncology planned to hold off on oral steroids on her last eval.  ? ?Past Medical, Surgical, Social History, Allergies, and Medications have been Reviewed. ? ?Past Medical History:  ?Diagnosis Date  ? Asthma   ? ? ?Current Meds  ?Medication Sig  ? cetirizine (ZYRTEC) 10 MG tablet Take 1 tablet (10 mg total) by mouth daily.  ?  ? ?Allergies:   Patient has no known allergies.  ? ?ROS ?See HPI for history of present illness. ? ?Physical Exam ?Constitutional:   ?   Appearance: Normal appearance. She is not ill-appearing.  ?Skin: ?   Comments: Raised papules noted to the face and arms  ?Neurological:  ?   Mental Status: She is alert.  ? ?MDM: pt with  hx of mycosis fungoides. Her main complaint is pruritus from her rash. She has seen ID and oncology for this and has been referred to derm. I have advised antihistamines and to continue her typical topical steroid for known eczema however given her hx I recommended we defer to oncology/derm for initiation of oral steroids of further treatment. She is encouraged to reach out to her oncologist regarding sxs. ?          ? ?There are no diagnoses linked to this encounter. ? ? ?Time:   ?Today, I have spent 15 minutes with the patient with telehealth technology discussing the above problems, reviewing the chart, previous notes, medications and orders.  ? ? ?Tests Ordered: ?No orders of the defined types were placed in this  encounter. ? ? ?Medication Changes: ?Meds ordered this encounter  ?Medications  ? cetirizine (ZYRTEC) 10 MG tablet  ?  Sig: Take 1 tablet (10 mg total) by mouth daily.  ?  Dispense:  30 tablet  ?  Refill:  11  ?  Order Specific Question:   Supervising Provider  ?  Answer:   Noemi Chapel [[id number]]  ? ? ? ?Disposition:  Follow up  ?Signed, ?Ashley Krapf Gilman Barber, Ashley Barber  ?12/12/2021 9:29 AM    ? ? ?

## 2021-12-13 ENCOUNTER — Telehealth: Payer: Self-pay | Admitting: Hematology

## 2021-12-13 NOTE — Telephone Encounter (Signed)
Rescheduled upcoming appointment due to provider's template. Patient is aware of changes. 

## 2021-12-20 ENCOUNTER — Inpatient Hospital Stay: Payer: BLUE CROSS/BLUE SHIELD | Attending: Hematology

## 2021-12-20 ENCOUNTER — Inpatient Hospital Stay (HOSPITAL_BASED_OUTPATIENT_CLINIC_OR_DEPARTMENT_OTHER): Payer: BLUE CROSS/BLUE SHIELD | Admitting: Hematology

## 2021-12-20 ENCOUNTER — Other Ambulatory Visit: Payer: Self-pay

## 2021-12-20 VITALS — BP 138/85 | HR 85 | Temp 97.3°F | Resp 20 | Wt 117.2 lb

## 2021-12-20 DIAGNOSIS — Z79899 Other long term (current) drug therapy: Secondary | ICD-10-CM | POA: Diagnosis not present

## 2021-12-20 DIAGNOSIS — C8409 Mycosis fungoides, extranodal and solid organ sites: Secondary | ICD-10-CM | POA: Diagnosis not present

## 2021-12-20 DIAGNOSIS — C8408 Mycosis fungoides, lymph nodes of multiple sites: Secondary | ICD-10-CM | POA: Diagnosis present

## 2021-12-20 DIAGNOSIS — R918 Other nonspecific abnormal finding of lung field: Secondary | ICD-10-CM | POA: Insufficient documentation

## 2021-12-20 DIAGNOSIS — Z21 Asymptomatic human immunodeficiency virus [HIV] infection status: Secondary | ICD-10-CM

## 2021-12-20 DIAGNOSIS — B20 Human immunodeficiency virus [HIV] disease: Secondary | ICD-10-CM | POA: Insufficient documentation

## 2021-12-20 LAB — CMP (CANCER CENTER ONLY)
ALT: 7 U/L (ref 0–44)
AST: 21 U/L (ref 15–41)
Albumin: 3.1 g/dL — ABNORMAL LOW (ref 3.5–5.0)
Alkaline Phosphatase: 75 U/L (ref 38–126)
Anion gap: 2 — ABNORMAL LOW (ref 5–15)
BUN: 6 mg/dL (ref 6–20)
CO2: 30 mmol/L (ref 22–32)
Calcium: 8.8 mg/dL — ABNORMAL LOW (ref 8.9–10.3)
Chloride: 104 mmol/L (ref 98–111)
Creatinine: 0.69 mg/dL (ref 0.44–1.00)
GFR, Estimated: >60 mL/min (ref >60)
Glucose, Bld: 81 mg/dL (ref 70–99)
Potassium: 4.2 mmol/L (ref 3.5–5.1)
Sodium: 136 mmol/L (ref 135–145)
Total Bilirubin: 0.3 mg/dL (ref 0.3–1.2)
Total Protein: 9.4 g/dL — ABNORMAL HIGH (ref 6.5–8.1)

## 2021-12-20 LAB — CBC WITH DIFFERENTIAL (CANCER CENTER ONLY)
Abs Immature Granulocytes: 0.01 10*3/uL (ref 0.00–0.07)
Basophils Absolute: 0 10*3/uL (ref 0.0–0.1)
Basophils Relative: 1 %
Eosinophils Absolute: 0.3 10*3/uL (ref 0.0–0.5)
Eosinophils Relative: 5 %
HCT: 37.2 % (ref 36.0–46.0)
Hemoglobin: 12.5 g/dL (ref 12.0–15.0)
Immature Granulocytes: 0 %
Lymphocytes Relative: 32 %
Lymphs Abs: 1.7 10*3/uL (ref 0.7–4.0)
MCH: 31.7 pg (ref 26.0–34.0)
MCHC: 33.6 g/dL (ref 30.0–36.0)
MCV: 94.4 fL (ref 80.0–100.0)
Monocytes Absolute: 0.7 10*3/uL (ref 0.1–1.0)
Monocytes Relative: 13 %
Neutro Abs: 2.6 10*3/uL (ref 1.7–7.7)
Neutrophils Relative %: 49 %
Platelet Count: 313 10*3/uL (ref 150–400)
RBC: 3.94 MIL/uL (ref 3.87–5.11)
RDW: 13.5 % (ref 11.5–15.5)
WBC Count: 5.4 10*3/uL (ref 4.0–10.5)
nRBC: 0 % (ref 0.0–0.2)

## 2021-12-20 LAB — LACTATE DEHYDROGENASE: LDH: 176 U/L (ref 98–192)

## 2021-12-20 MED ORDER — HYDROXYZINE HCL 25 MG PO TABS: 25.0000 mg | ORAL_TABLET | Freq: Three times a day (TID) | ORAL | 1 refills | Status: DC | PRN

## 2021-12-21 ENCOUNTER — Telehealth: Payer: Self-pay | Admitting: Hematology

## 2021-12-21 NOTE — Telephone Encounter (Signed)
Scheduled follow-up appointment per 4/19 los. Patient is aware. ?

## 2021-12-23 ENCOUNTER — Other Ambulatory Visit: Payer: Self-pay | Admitting: Hematology

## 2021-12-23 DIAGNOSIS — C8409 Mycosis fungoides, extranodal and solid organ sites: Secondary | ICD-10-CM

## 2021-12-26 ENCOUNTER — Other Ambulatory Visit: Payer: Self-pay
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
## 2022-01-01 ENCOUNTER — Other Ambulatory Visit: Payer: Self-pay

## 2022-01-01 ENCOUNTER — Other Ambulatory Visit: Payer: Medicaid Other

## 2022-01-01 ENCOUNTER — Ambulatory Visit: Payer: Medicaid Other | Admitting: Hematology

## 2022-01-01 NOTE — Telephone Encounter (Signed)
Error

## 2022-01-02 ENCOUNTER — Ambulatory Visit: Payer: BLUE CROSS/BLUE SHIELD | Admitting: Physician Assistant

## 2022-01-09 ENCOUNTER — Other Ambulatory Visit: Payer: Self-pay

## 2022-01-09 ENCOUNTER — Ambulatory Visit: Payer: BLUE CROSS/BLUE SHIELD | Admitting: Student

## 2022-01-09 DIAGNOSIS — C8409 Mycosis fungoides, extranodal and solid organ sites: Secondary | ICD-10-CM

## 2022-01-09 NOTE — Progress Notes (Deleted)
    SUBJECTIVE:   CHIEF COMPLAINT / HPI:   ***  PERTINENT  PMH / PSH: ***  OBJECTIVE:   There were no vitals taken for this visit.  ***  ASSESSMENT/PLAN:   No problem-specific Assessment & Plan notes found for this encounter.   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 Dr. Jessy Oto medical oncology-for specialist cares of her mycosis fungoides in the context of HIV AIDS was recommended by oncology but was declined by insurance.  recently diagnosed HIV/AIDS + patient following with infectious disease Leanord Hawking FNP has been started on Biktarvy  skin lesion on right breast has healed but with axillary adenopathy reminded patient to follow-up on her mammogram and ultrasound of the axilla with biopsy if needed -Patient has missed her appointments on multiple occasions and has not had this done  nonspecific pulmonary nodules -in the setting of HIV could suggest an inflammatory or infectious condition..  Less likely lymphoma.  Could be from inflammation related to smoking. Currently has no respiratory symptoms reported.  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 Yvette Rack, [place]

## 2022-01-17 ENCOUNTER — Inpatient Hospital Stay: Payer: 59

## 2022-01-17 ENCOUNTER — Inpatient Hospital Stay: Payer: 59 | Admitting: Hematology

## 2022-01-17 ENCOUNTER — Telehealth: Payer: Self-pay | Admitting: Hematology

## 2022-01-17 NOTE — Progress Notes (Incomplete)
? ? ?HEMATOLOGY/ONCOLOGY CLINIC VISIT ? ?Date of Service: 01/17/2022 ? ?Patient Care Team: ?[name], MD as PCP - General (Family Medicine) ? 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 ? ? ?HISTORY OF PRESENTING ILLNESS:  ?Please see previous note for details on initial presentation ? ?INTERVAL HISTORY: ?Ms .Ashley Barber is a 41 y.o. 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  ? ?She was previously referred to [facility] for dermatologic oncology and medical oncology care but this was declined by her insurance. ? ?*** ? ?***She notes that she has had new papular rash over her face trunk and upper extremities which was evaluated by infectious disease doctor. Her Biktarvy was thought to be possibly responsible and this medication was switched to Dovato for her HIV/AIDS treatment.  She was also placed on Zyrtec and Pepcid and has been using as needed Benadryl and hydroxyzine for pruritus. Patient notes her rash has not really improved although some areas seem to be healing up with some hyperpigmentation. ?She is understandably upset with how her skin condition is affecting her quality of life. ? ?She is also disturbed by her HIV AIDS diagnosis and the fact that she cannot tell her family about it and feels somewhat isolated. She has been compliant with her HIV medications. ? ?Notes significant change in her bowel habits.*** ? ?No new lumps or bumps. ?No fevers no chills no night sweats. ?No significant change in her breathing.   ?No other new or acute focal symptoms. ? ?Labs done today were reviewed in detail. ?CBC ***. ?CMP ***. ?LDH at ***. ? ?MEDICAL HISTORY:  ?Past Medical History:  ?Diagnosis Date  ? Asthma   ? ? ?SURGICAL HISTORY: ?Past Surgical History:  ?Procedure Laterality Date  ? HAND SURGERY    ? ? ?SOCIAL HISTORY: ?Social History  ? ?Socioeconomic History  ? Marital status: Single  ?  Spouse name: Not on  file  ? Number of children: Not on file  ? Years of education: Not on file  ? Highest education level: Not on file  ?Occupational History  ? Not on file  ?Tobacco Use  ? Smoking status: Every Day  ?  Packs/day: 0.50  ?  Types: Cigarettes  ? Smokeless tobacco: Current  ? Tobacco comments:  ?  Feeling anxious lately and states is smoking more frequently  ?Substance and Sexual Activity  ? Alcohol use: Yes  ?  Comment: occasionally - wine  ? Drug use: Yes  ?  Frequency: 4.0 times per week  ?  Types: Marijuana  ?  Comment: occasionally  ? Sexual activity: Not Currently  ?  Birth control/protection: Abstinence  ?  Comment: condoms accepted  ?Other Topics Concern  ? Not on file  ?Social History Narrative  ? Not on file  ? ?Social Determinants of Health  ? ?Financial Resource Strain: Not on file  ?Food Insecurity: Not on file  ?Transportation Needs: Not on file  ?Physical Activity: Not on file  ?Stress: Not on file  ?Social Connections: Not on file  ?Intimate Partner Violence: Not on file  ? ? ?FAMILY HISTORY: ?Family History  ?Problem Relation Age of Onset  ? Dementia Mother   ? ? ?ALLERGIES:  has No Known Allergies. ? ?MEDICATIONS:  ?Current Outpatient Medications  ?Medication Sig Dispense Refill  ? cetirizine (ZYRTEC) 10 MG tablet Take 1 tablet (10 mg total) by mouth daily. 30 tablet 11  ? diclofenac Sodium (VOLTAREN) 1 % GEL  Apply 2 g topically 4 (four) times daily. 50 g 3  ? dolutegravir-lamiVUDine (DOVATO) 50-300 MG tablet Take 1 tablet by mouth daily. 30 tablet 4  ? doxycycline (VIBRA-TABS) 100 MG tablet Take 1 tablet (100 mg total) by mouth 2 (two) times daily. (Patient not taking: Reported on 12/20/2021) 20 tablet 0  ? famotidine (PEPCID) 20 MG tablet Take 1 tablet (20 mg total) by mouth daily. (Patient not taking: Reported on 10/25/2021) 90 tablet 3  ? hydrOXYzine (ATARAX) 25 MG tablet Take 1 tablet (25 mg total) by mouth 3 (three) times daily as needed for itching. 45 tablet 1  ? triamcinolone ointment (KENALOG)  0.1 % Apply 1 application topically 2 (two) times daily. 453.6 g 2  ? ?No current facility-administered medications for this visit.  ? ? ?REVIEW OF SYSTEMS:   ?. 10 Point review of Systems was done is negative except as noted above. ? ? ? ?PHYSICAL EXAMINATION:  ?There were no vitals taken for this visit. ?NAD*** ?GENERAL:alert, in no acute distress and comfortable ?SKIN: no acute rashes, no significant lesions ?EYES: conjunctiva are pink and non-injected, sclera anicteric ?NECK: supple, no JVD ?LYMPH:  no palpable lymphadenopathy in the cervical, axillary or inguinal regions ?LUNGS: clear to auscultation b/l with normal respiratory effort ?HEART: regular rate & rhythm ?ABDOMEN:  normoactive bowel sounds , non tender, not distended. ?Extremity: no pedal edema ?PSYCH: alert & oriented x 3 with fluent speech ?NEURO: no focal motor/sensory deficits ? ?LABORATORY DATA:  ?I have reviewed the data as listed ? ?. ? ?  Latest Ref Rng & Units 12/20/2021  ?  9:44 AM 08/04/2021  ? 11:15 AM 03/17/2021  ?  1:35 PM  ?CBC  ?WBC 4.0 - 10.5 K/uL 5.4   5.2   4.6    ?Hemoglobin 12.0 - 15.0 g/dL 12.5   12.0   12.9    ?Hematocrit 36.0 - 46.0 % 37.2   36.4   36.8    ?Platelets 150 - 400 K/uL 313   255   260    ? ? ?. ? ?  Latest Ref Rng & Units 12/20/2021  ?  9:44 AM 08/04/2021  ? 11:15 AM 03/17/2021  ?  1:35 PM  ?CMP  ?Glucose 70 - 99 mg/dL 81   69   85    ?BUN 6 - 20 mg/dL 6   3   <4    ?Creatinine 0.44 - 1.00 mg/dL 0.69   0.54   0.71    ?Sodium 135 - 145 mmol/L 136   134   133    ?Potassium 3.5 - 5.1 mmol/L 4.2   3.6   3.9    ?Chloride 98 - 111 mmol/L 104   103   102    ?CO2 22 - 32 mmol/L '30   28   27    '$ ?Calcium 8.9 - 10.3 mg/dL 8.8   8.5   8.8    ?Total Protein 6.5 - 8.1 g/dL 9.4   9.5   10.6    ?Total Bilirubin 0.3 - 1.2 mg/dL 0.3   0.2   <0.2    ?Alkaline Phos 38 - 126 U/L 75    61    ?AST 15 - 41 U/L '21   24   24    '$ ?ALT 0 - 44 U/L '7   8   9    '$ ? ?01/09/2021 Dermatology Pathology ? ? ? ?RADIOGRAPHIC STUDIES: ?I have personally  reviewed the radiological images as listed and agreed  with the findings in the report. ?CT chest abdomen pelvis 06/07/2021: ?Marland Kitchen Numerous prominent bilateral axillary and subpectoral lymph ?nodes, largest right axillary nodes measuring 1.9 x 1.0 cm. Enlarged ?bilateral inguinal and iliac lymph nodes, largest right external ?iliac node measuring 1.7 x 1.6 cm. These are generally nonspecific, ?and particularly inguinal iliac lymph nodes may be reactive to the ?cutaneous process described below. ?2. Extensive soft tissue thickening and subcutaneous fat stranding ?about the mons, labia, and inguinal creases, with a small ?subcutaneous fluid collection of the inferior right inguinal canal ?measuring approximately 0.9 cm. This appearance suggests ?hidradenitis. Correlate with physical examination. ?3. Multiple small pulmonary nodules of the right lung, nonspecific, ?possibly infectious or inflammatory in nature however pulmonary ?lymphomatous involvement is not strictly excluded. ?4. Diffuse bilateral bronchial wall thickening, consistent with ?nonspecific infectious or inflammatory bronchitis. ?5. Fluid attenuation lesion of the left ovary measuring ?approximately 4.9 x 3.5 cm, almost certainly benign and functional ?in the reproductive age setting ? ?ASSESSMENT & PLAN:  ? ?41yo with  ? ?1) Recently diagnosed Mycosis Fungiodes ? ?Did have >50% skin involvement but with significant response to prednisone -use prior to her HIV diagnosis. ?Currently intermittent outbreaks over her trunk which are controlled with topical triamcinolone ointment. ?CT does show generalized lymphadenopathy which could be inflammatory or related to her HIV and less likely related to her lymphoma ? ?2) recently diagnosed HIV/AIDS + patient following with infectious disease Leanord Hawking FNP has been started on Biktarvy ? ?3) skin lesion on right breast has healed but with axillary adenopathy reminded patient to follow-up on her mammogram and ultrasound  of the axilla with biopsy if needed ?-Patient has missed her appointments on multiple occasions and has not had this done ? ?4) nonspecific pulmonary nodules -in the setting of HIV could suggest an inflammato

## 2022-01-17 NOTE — Telephone Encounter (Signed)
Patient called to reschedule today's appointment due to needing to stay home to take care of her mother. Patient is aware of changes. ?

## 2022-01-22 NOTE — Progress Notes (Signed)
    SUBJECTIVE:   CHIEF COMPLAINT / HPI:   Rash: Patient complains of rash involving the abdomen, back, and face. Rash started 2 weeks ago. Appearance of rash at onset: Color of lesion(s): pink. Rash has changed over time and seems to have progressed; started initially on face and migrated to chest and back, as well as upper arms.  Discomfort associated with rash: is painful and is pruritic.  Associated symptoms: none. Denies: congestion, decrease in appetite, decrease in energy level, fever, headache, sore throat, and vomiting. Patient has had previous evaluation of rash. Patient has had previous treatment.  She shows no response to treatment with triamcinolone. Patient has not had contacts with similar rash. Patient has not identified precipitant. Patient has not had new exposures (soaps, lotions, laundry detergents, foods, medications, plants, insects or animals.)  This rash is not like her mycosis fungoides and her HIV is undetectable. She has a solid CD4 count and notes that triamcinolone keeps the mycosis at [place].   Patient has had scabies before and feels this could be similar.      10/25/2021    4:01 PM 08/04/2021   10:32 AM 08/01/2021    9:10 AM  PHQ9 SCORE ONLY  PHQ-9 Total Score 0 0 0    PERTINENT  PMH / PSH:  Patient Active Problem List   Diagnosis Date Noted   Healthcare maintenance 09/07/2021   HIV disease ([place]) 08/01/2021   Abscess 07/13/2021   Encounter for management and injection of depo-Provera 02/25/2021   Biological false positive RPR test 01/09/2021   Rash 01/04/2021   Mycosis fungoides ([place]) 08/11/2020     OBJECTIVE:   BP 127/71   Pulse 88   Wt 121 lb 6 oz (55.1 kg)   LMP 01/25/2022   SpO2 97%   BMI 21.85 kg/m   General: Alert and oriented in no apparent distress, pleasant AA F Heart: Regular rate and rhythm with no murmurs appreciated Lungs: CTA bilaterally, no wheezing Abdomen: Bowel sounds present, no abdominal pain Skin: Warm and dry; papular  rash over chest, back and face; less on upper arms. Pink in color without drainage. Excoriations throughout without obvious burrowing Extremities: No lower extremity edema   ASSESSMENT/PLAN:   Rash New onset rash with broad differential given medical history and immunosuppression in past. Appears to be consistent with folliculitis, although will not rule out secondary syphilis, pityriasis rosea, scabies. Doxycycline course given to patient. Previous provider rx permethrin; can use if she feels it will be beneficial but would advise one treatment at a time to isolate possible cause of improvement of symptoms. Patient has upcoming derm appt. RPR ordered as well, history of false positive, will monitor for result.   Mycosis fungoides ([facility]) Continue triamcinolone.; upcoming dermatology appt and follows with oncology.   HIV disease ([place]) Continue Dovato per ID.  Healthcare maintenance Patient needs pap smear and mammogram. Skin lesion with axillary adenopathy for which she has missed U/S appt. Provided [facility] information to schedule mammogram and axillary U/S as it was already ordered. Expressed importance of doing this. Will need pap smear ASAP.   Encounter for management and injection of depo-Provera Given today Upreg negative    Patient will need photo of rash in chart if persists to next visit, needs mammogram, axillary U/S, and pap.    [name], MD [place]

## 2022-01-24 ENCOUNTER — Telehealth: Payer: Medicaid Other | Admitting: Physician Assistant

## 2022-01-24 DIAGNOSIS — L298 Other pruritus: Secondary | ICD-10-CM

## 2022-01-24 MED ORDER — FAMOTIDINE 40 MG PO TABS: 40.0000 mg | ORAL_TABLET | Freq: Every day | ORAL | 0 refills | Status: DC

## 2022-01-24 MED ORDER — LEVOCETIRIZINE DIHYDROCHLORIDE 5 MG PO TABS: 5.0000 mg | ORAL_TABLET | Freq: Every evening | ORAL | 0 refills | Status: AC

## 2022-01-24 MED ORDER — PERMETHRIN 5 % EX CREA: TOPICAL_CREAM | CUTANEOUS | 0 refills | Status: DC

## 2022-01-24 MED ORDER — HYDROXYZINE PAMOATE 50 MG PO CAPS: 50.0000 mg | ORAL_CAPSULE | Freq: Three times a day (TID) | ORAL | 0 refills | Status: DC | PRN

## 2022-01-24 NOTE — Progress Notes (Signed)
Virtual Visit Consent   Ashley Barber, you are scheduled for a virtual visit with a [facility] provider today. Just as with appointments in the office, your consent must be obtained to participate. Your consent will be active for this visit and any virtual visit you may have with one of our providers in the next 365 days. If you have a MyChart account, a copy of this consent can be sent to you electronically.  As this is a virtual visit, video technology does not allow for your provider to perform a traditional examination. This may limit your provider's ability to fully assess your condition. If your provider identifies any concerns that need to be evaluated in person or the need to arrange testing (such as labs, EKG, etc.), we will make arrangements to do so. Although advances in technology are sophisticated, we cannot ensure that it will always work on either your end or our end. If the connection with a video visit is poor, the visit may have to be switched to a telephone visit. With either a video or telephone visit, we are not always able to ensure that we have a secure connection.  By engaging in this virtual visit, you consent to the provision of healthcare and authorize for your insurance to be billed (if applicable) for the services provided during this visit. Depending on your insurance coverage, you may receive a charge related to this service.  I need to obtain your verbal consent now. Are you willing to proceed with your visit today? [name] has provided verbal consent on 01/24/2022 for a virtual visit (video or telephone). Ashley Barber, [place]  Date: 01/24/2022 9:30 AM  Virtual Visit via Video Note   I, Ashley Barber, connected with  CHARRISSE MASLEY  ([id number], [birth year]) on 01/24/22 at  9:15 AM EDT by a video-enabled telemedicine application and verified that I am speaking with the correct person using two identifiers.  Location: Patient: Virtual Visit  Location Patient: Home Provider: Virtual Visit Location Provider: Home Office   I discussed the limitations of evaluation and management by telemedicine and the availability of in person appointments. The patient expressed understanding and agreed to proceed.    History of Present Illness: Ashley Barber is a 40 y.o. who identifies as a female who was assigned female at birth, and is being seen today for continued pruritic rash that has become more widespread. Initially seen a month ago by her Oncologist for this giving history of mycosis fungoides. Had had biopsy of a different rash per patient that was positive for MF. Has dermatology appointment upcoming. Was given Hydroxyzine 25 mg TID with zyrtec but is not helping significantly enough with the itch. Notes she is scratching her skin "like crazy" and is worse at night, affecting sleep. Notes she has had scabies before and some areas remind her of that. Denies change to soaps, lotions or detergents. Is anxious about her recent diagnosis of MF, especially in the setting of her HIV.  HPI: HPI  Problems:  Patient Active Problem List   Diagnosis Date Noted   Healthcare maintenance 09/07/2021   HIV disease ([place]) 08/01/2021   Abscess 07/13/2021   Hand injury, left, sequela 02/27/2021   COVID-19 vaccine administered 02/27/2021   Encounter for management and injection of depo-Provera 02/25/2021   Routine screening for STI (sexually transmitted infection) 02/02/2021   Cellulitis of left upper extremity 01/20/2021   Biological false positive RPR test 01/09/2021   Rash 01/04/2021  Mycosis fungoides ([place]) 08/11/2020    Allergies: No Known Allergies Medications:  Current Outpatient Medications:    famotidine (PEPCID) 40 MG tablet, Take 1 tablet (40 mg total) by mouth daily., Disp: 30 tablet, Rfl: 0   hydrOXYzine (VISTARIL) 50 MG capsule, Take 1 capsule (50 mg total) by mouth 3 (three) times daily as needed., Disp: 30 capsule, Rfl: 0    levocetirizine (XYZAL) 5 MG tablet, Take 1 tablet (5 mg total) by mouth every evening., Disp: 30 tablet, Rfl: 0   diclofenac Sodium (VOLTAREN) 1 % GEL, Apply 2 g topically 4 (four) times daily., Disp: 50 g, Rfl: 3   dolutegravir-lamiVUDine (DOVATO) 50-300 MG tablet, Take 1 tablet by mouth daily., Disp: 30 tablet, Rfl: 4  Observations/Objective: Patient is well-developed, well-nourished in no acute distress.  Resting comfortably at home.  Head is normocephalic, atraumatic.  No labored breathing. Speech is clear and coherent with logical content.  Patient is alert and oriented at baseline.  Widespread rash including her trunk, extremities, neck and face, sparing palms and soles. The lesions that are visible are small, papular and are erythematous. Some areas with what seems to be linear burrowing noted but not on all of these areas. Excoriation of certain areas noted without any evidence of superimposed cellulitis.   Assessment and Plan: 1. Chronic pruritic rash in adult - levocetirizine (XYZAL) 5 MG tablet; Take 1 tablet (5 mg total) by mouth every evening.  Dispense: 30 tablet; Refill: 0 - famotidine (PEPCID) 40 MG tablet; Take 1 tablet (40 mg total) by mouth daily.  Dispense: 30 tablet; Refill: 0 - hydrOXYzine (VISTARIL) 50 MG capsule; Take 1 capsule (50 mg total) by mouth 3 (three) times daily as needed.  Dispense: 30 capsule; Refill: 0  In patient with mycosis fungoides confirmed by recent biopsy of a different type of lesion of her L arm. Is awaiting further evaluation and management from Dermatology with appointment on 02/05/2022 per patient. These lesions are different per patient and she has concerns of scabies as she has had before. Giving some of the burrowing noted in some lesions, will give her permethrin for one dose. Will increase hydroxyzine to 50 mg at bedtime, 25 mg during day. Start xyzal once daily and famotidine 40 mg once daily to further help with itch and her bodies reaction to  this rash. Follow-up with Dermatology as scheduled for ongoing evaluation and management.   Follow Up Instructions: I discussed the assessment and treatment plan with the patient. The patient was provided an opportunity to ask questions and all were answered. The patient agreed with the plan and demonstrated an understanding of the instructions.  A copy of instructions were sent to the patient via MyChart unless otherwise noted below.   The patient was advised to call back or seek an in-person evaluation if the symptoms worsen or if the condition fails to improve as anticipated.  Time:  I spent 15 minutes with the patient via telehealth technology discussing the above problems/concerns.    Ashley Rio, PA-C

## 2022-01-24 NOTE — Patient Instructions (Signed)
  Lockie Mola, thank you for joining [name], PA-C for today's virtual visit.  While this provider is not your primary care provider (PCP), if your PCP is located in our provider database this encounter information will be shared with them immediately following your visit.  Consent: (Patient) Ashley Barber provided verbal consent for this virtual visit at the beginning of the encounter.  Current Medications:  Current Outpatient Medications:    cetirizine (ZYRTEC) 10 MG tablet, Take 1 tablet (10 mg total) by mouth daily., Disp: 30 tablet, Rfl: 11   diclofenac Sodium (VOLTAREN) 1 % GEL, Apply 2 g topically 4 (four) times daily., Disp: 50 g, Rfl: 3   dolutegravir-lamiVUDine (DOVATO) 50-300 MG tablet, Take 1 tablet by mouth daily., Disp: 30 tablet, Rfl: 4   doxycycline (VIBRA-TABS) 100 MG tablet, Take 1 tablet (100 mg total) by mouth 2 (two) times daily. (Patient not taking: Reported on 12/20/2021), Disp: 20 tablet, Rfl: 0   famotidine (PEPCID) 20 MG tablet, Take 1 tablet (20 mg total) by mouth daily. (Patient not taking: Reported on 10/25/2021), Disp: 90 tablet, Rfl: 3   hydrOXYzine (ATARAX) 25 MG tablet, Take 1 tablet (25 mg total) by mouth 3 (three) times daily as needed for itching., Disp: 45 tablet, Rfl: 1   triamcinolone ointment (KENALOG) 0.1 %, Apply 1 application topically 2 (two) times daily., Disp: 453.6 g, Rfl: 2   Medications ordered in this encounter:  No orders of the defined types were placed in this encounter.    *If you need refills on other medications prior to your next appointment, please contact your pharmacy*  Follow-Up: Call back or seek an in-person evaluation if the symptoms worsen or if the condition fails to improve as anticipated.  Other Instructions Stop Cetirizine. Start the Xyzal and Famotidine daily as directed.  Use the permethrin once as directed, washing off the next morning.  Increase the Hydroxyzine as discussed.    If you have been  instructed to have an in-person evaluation today at a local Urgent Care facility, please use the link below. It will take you to a list of all of our available [facility], including address, phone number and hours of operation. Please do not delay care.  [facility]  If you or a family member do not have a primary care provider, use the link below to schedule a visit and establish care. When you choose a [facility] primary care physician or advanced practice provider, you gain a long-term partner in health. Find a Primary Care Provider  Learn more about [facility]'s in-office and virtual care options: [facility]

## 2022-01-25 ENCOUNTER — Ambulatory Visit (INDEPENDENT_AMBULATORY_CARE_PROVIDER_SITE_OTHER): Payer: Self-pay | Admitting: Student

## 2022-01-25 ENCOUNTER — Encounter: Payer: Self-pay | Admitting: Student

## 2022-01-25 VITALS — BP 127/71 | HR 88 | Wt 121.4 lb

## 2022-01-25 DIAGNOSIS — R21 Rash and other nonspecific skin eruption: Secondary | ICD-10-CM

## 2022-01-25 DIAGNOSIS — C8409 Mycosis fungoides, extranodal and solid organ sites: Secondary | ICD-10-CM

## 2022-01-25 DIAGNOSIS — B20 Human immunodeficiency virus [HIV] disease: Secondary | ICD-10-CM

## 2022-01-25 DIAGNOSIS — Z0000 Encounter for general adult medical examination without abnormal findings: Secondary | ICD-10-CM

## 2022-01-25 DIAGNOSIS — Z3009 Encounter for other general counseling and advice on contraception: Secondary | ICD-10-CM

## 2022-01-25 DIAGNOSIS — Z7251 High risk heterosexual behavior: Secondary | ICD-10-CM

## 2022-01-25 DIAGNOSIS — Z3042 Encounter for surveillance of injectable contraceptive: Secondary | ICD-10-CM

## 2022-01-25 LAB — POCT URINE PREGNANCY: Preg Test, Ur: NEGATIVE

## 2022-01-25 MED ORDER — MEDROXYPROGESTERONE ACETATE 150 MG/ML IM SUSP
150.0000 mg | Freq: Once | INTRAMUSCULAR | Status: AC
  Administered 2022-01-25: 150 mg via INTRAMUSCULAR

## 2022-01-25 MED ORDER — DOXYCYCLINE HYCLATE 100 MG PO TABS: 100.0000 mg | ORAL_TABLET | Freq: Two times a day (BID) | ORAL | 0 refills | Status: AC

## 2022-01-25 NOTE — Assessment & Plan Note (Addendum)
New onset rash with broad differential given medical history and immunosuppression in past. Appears to be consistent with folliculitis, although will not rule out secondary syphilis, pityriasis rosea, scabies. Doxycycline course given to patient. Previous provider rx permethrin; can use if she feels it will be beneficial but would advise one treatment at a time to isolate possible cause of improvement of symptoms. Patient has upcoming derm appt. RPR ordered as well, history of false positive, will monitor for result.

## 2022-01-25 NOTE — Patient Instructions (Addendum)
It was great to see you today! Thank you for choosing [facility] for your primary care. Ashley Barber was seen for rash.  Today we addressed: Continuing with Doxycycline 100 mg BID after FOOD for a total of 7 days  Please try to schedule for the mammogram and ultrasound soon Can hold off on permethrin cream for now  I will call with the RPR results    Orders Placed This Encounter  Procedures   RPR   POCT urine pregnancy   Meds ordered this encounter  Medications   doxycycline (VIBRA-TABS) 100 MG tablet    Sig: Take 1 tablet (100 mg total) by mouth 2 (two) times daily for 7 days.    Dispense:  14 tablet    Refill:  0    If you haven't already, sign up for My Chart to have easy access to your labs results, and communication with your primary care physician.  We are checking some labs today. If they are abnormal, I will call you. If they are normal, I will send you a MyChart message (if it is active) or a letter in the mail. If you do not hear about your labs in the next 2 weeks, please call the office.   You should return to our clinic Return in about 1 month (around 02/25/2022) for assess rash; check up after derm appt .  I recommend that you always bring your medications to each appointment as this makes it easy to ensure you are on the correct medications and helps [place] not miss refills when you need them.  Please arrive 15 minutes before your appointment to ensure smooth check in process.  We appreciate your efforts in making this happen.  Please call the clinic at [phone] if your symptoms worsen or you have any concerns.  Thank you for allowing me to participate in your care, '@[email or web address]'$ @

## 2022-01-25 NOTE — Assessment & Plan Note (Signed)
Given today Upreg negative

## 2022-01-25 NOTE — Assessment & Plan Note (Signed)
Continue Dovato per ID.

## 2022-01-25 NOTE — Assessment & Plan Note (Signed)
Continue triamcinolone.; upcoming dermatology appt and follows with oncology.

## 2022-01-25 NOTE — Assessment & Plan Note (Signed)
Patient needs pap smear and mammogram. Skin lesion with axillary adenopathy for which she has missed U/S appt. Provided [facility] information to schedule mammogram and axillary U/S as it was already ordered. Expressed importance of doing this. Will need pap smear ASAP.

## 2022-01-29 ENCOUNTER — Other Ambulatory Visit: Payer: Self-pay | Admitting: Family

## 2022-02-06 ENCOUNTER — Encounter: Payer: Self-pay | Admitting: *Deleted

## 2022-02-09 ENCOUNTER — Inpatient Hospital Stay: Payer: 59 | Admitting: Hematology

## 2022-02-09 ENCOUNTER — Inpatient Hospital Stay: Payer: 59

## 2022-02-13 ENCOUNTER — Inpatient Hospital Stay: Payer: 59

## 2022-02-13 ENCOUNTER — Inpatient Hospital Stay: Payer: 59 | Admitting: Hematology

## 2022-02-14 ENCOUNTER — Ambulatory Visit: Payer: Medicaid Other | Admitting: Student

## 2022-02-14 NOTE — Progress Notes (Deleted)
    SUBJECTIVE:   CHIEF COMPLAINT / HPI:   Rash f/u: Treated as folliculitis and was given a course of Dozycycline to treat systemically. Patient reports ***.   PERTINENT  PMH / PSH: ***  OBJECTIVE:   LMP 01/25/2022   ***  ASSESSMENT/PLAN:   No problem-specific Assessment & Plan notes found for this encounter.     [name], MD [place]

## 2022-02-21 ENCOUNTER — Other Ambulatory Visit: Payer: 59

## 2022-02-21 ENCOUNTER — Telehealth: Payer: Self-pay | Admitting: Hematology

## 2022-02-21 ENCOUNTER — Inpatient Hospital Stay: Payer: 59 | Admitting: Hematology

## 2022-02-21 ENCOUNTER — Ambulatory Visit: Payer: 59 | Admitting: Hematology

## 2022-02-21 ENCOUNTER — Inpatient Hospital Stay: Payer: 59

## 2022-02-21 NOTE — Telephone Encounter (Signed)
Patient called to reschedule today's appointment due to transportation. Patient is aware of changes.

## 2022-02-23 ENCOUNTER — Other Ambulatory Visit: Payer: Self-pay

## 2022-02-23 ENCOUNTER — Inpatient Hospital Stay: Payer: 59 | Admitting: Hematology

## 2022-02-23 ENCOUNTER — Inpatient Hospital Stay: Payer: 59 | Attending: Hematology

## 2022-02-23 VITALS — BP 132/86 | HR 77 | Temp 97.7°F | Resp 20 | Wt 121.5 lb

## 2022-02-23 DIAGNOSIS — R69 Illness, unspecified: Secondary | ICD-10-CM | POA: Diagnosis not present

## 2022-02-23 DIAGNOSIS — F17210 Nicotine dependence, cigarettes, uncomplicated: Secondary | ICD-10-CM | POA: Diagnosis not present

## 2022-02-23 DIAGNOSIS — C8408 Mycosis fungoides, lymph nodes of multiple sites: Secondary | ICD-10-CM | POA: Diagnosis not present

## 2022-02-23 DIAGNOSIS — B20 Human immunodeficiency virus [HIV] disease: Secondary | ICD-10-CM | POA: Insufficient documentation

## 2022-02-23 DIAGNOSIS — Z880 Allergy status to penicillin: Secondary | ICD-10-CM | POA: Insufficient documentation

## 2022-02-23 DIAGNOSIS — Z91199 Patient's noncompliance with other medical treatment and regimen due to unspecified reason: Secondary | ICD-10-CM | POA: Diagnosis not present

## 2022-02-23 DIAGNOSIS — R918 Other nonspecific abnormal finding of lung field: Secondary | ICD-10-CM | POA: Insufficient documentation

## 2022-02-23 DIAGNOSIS — R21 Rash and other nonspecific skin eruption: Secondary | ICD-10-CM | POA: Insufficient documentation

## 2022-02-23 DIAGNOSIS — J45909 Unspecified asthma, uncomplicated: Secondary | ICD-10-CM | POA: Diagnosis not present

## 2022-02-23 DIAGNOSIS — C8409 Mycosis fungoides, extranodal and solid organ sites: Secondary | ICD-10-CM | POA: Diagnosis not present

## 2022-02-23 LAB — CMP (CANCER CENTER ONLY)
ALT: 8 U/L (ref 0–44)
AST: 18 U/L (ref 15–41)
Albumin: 3.6 g/dL (ref 3.5–5.0)
Alkaline Phosphatase: 56 U/L (ref 38–126)
Anion gap: 3 — ABNORMAL LOW (ref 5–15)
BUN: 7 mg/dL (ref 6–20)
CO2: 26 mmol/L (ref 22–32)
Calcium: 9.2 mg/dL (ref 8.9–10.3)
Chloride: 106 mmol/L (ref 98–111)
Creatinine: 0.76 mg/dL (ref 0.44–1.00)
GFR, Estimated: >60 mL/min (ref >60)
Glucose, Bld: 129 mg/dL — ABNORMAL HIGH (ref 70–99)
Potassium: 3.8 mmol/L (ref 3.5–5.1)
Sodium: 135 mmol/L (ref 135–145)
Total Bilirubin: 0.2 mg/dL — ABNORMAL LOW (ref 0.3–1.2)
Total Protein: 9.2 g/dL — ABNORMAL HIGH (ref 6.5–8.1)

## 2022-02-23 LAB — CBC WITH DIFFERENTIAL (CANCER CENTER ONLY)
Abs Immature Granulocytes: 0 10*3/uL (ref 0.00–0.07)
Basophils Absolute: 0 10*3/uL (ref 0.0–0.1)
Basophils Relative: 0 %
Eosinophils Absolute: 0.2 10*3/uL (ref 0.0–0.5)
Eosinophils Relative: 3 %
HCT: 40.3 % (ref 36.0–46.0)
Hemoglobin: 13.1 g/dL (ref 12.0–15.0)
Immature Granulocytes: 0 %
Lymphocytes Relative: 21 %
Lymphs Abs: 1.1 10*3/uL (ref 0.7–4.0)
MCH: 30.4 pg (ref 26.0–34.0)
MCHC: 32.5 g/dL (ref 30.0–36.0)
MCV: 93.5 fL (ref 80.0–100.0)
Monocytes Absolute: 0.5 10*3/uL (ref 0.1–1.0)
Monocytes Relative: 9 %
Neutro Abs: 3.3 10*3/uL (ref 1.7–7.7)
Neutrophils Relative %: 67 %
Platelet Count: 294 10*3/uL (ref 150–400)
RBC: 4.31 MIL/uL (ref 3.87–5.11)
RDW: 13.5 % (ref 11.5–15.5)
WBC Count: 5 10*3/uL (ref 4.0–10.5)
nRBC: 0 % (ref 0.0–0.2)

## 2022-02-23 LAB — LACTATE DEHYDROGENASE: LDH: 188 U/L (ref 98–192)
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
## 2022-02-26 ENCOUNTER — Telehealth: Payer: Self-pay | Admitting: Hematology

## 2022-02-26 NOTE — Telephone Encounter (Signed)
Left message with follow-up appointment per 6/23 los.

## 2022-03-05 ENCOUNTER — Ambulatory Visit: Payer: 59

## 2022-03-05 ENCOUNTER — Other Ambulatory Visit: Payer: BLUE CROSS/BLUE SHIELD

## 2022-03-09 ENCOUNTER — Other Ambulatory Visit: Payer: Self-pay

## 2022-03-09 DIAGNOSIS — Z113 Encounter for screening for infections with a predominantly sexual mode of transmission: Secondary | ICD-10-CM

## 2022-03-09 DIAGNOSIS — B20 Human immunodeficiency virus [HIV] disease: Secondary | ICD-10-CM

## 2022-03-09 DIAGNOSIS — Z79899 Other long term (current) drug therapy: Secondary | ICD-10-CM

## 2022-03-12 ENCOUNTER — Other Ambulatory Visit: Payer: BLUE CROSS/BLUE SHIELD

## 2022-03-12 ENCOUNTER — Ambulatory Visit: Payer: 59

## 2022-03-19 ENCOUNTER — Ambulatory Visit: Payer: 59

## 2022-03-19 ENCOUNTER — Encounter: Payer: BLUE CROSS/BLUE SHIELD | Admitting: Family

## 2022-03-19 ENCOUNTER — Encounter: Payer: Self-pay | Admitting: Student

## 2022-03-19 ENCOUNTER — Ambulatory Visit: Payer: 59 | Admitting: Student

## 2022-03-19 NOTE — Progress Notes (Deleted)
  SUBJECTIVE:   CHIEF COMPLAINT / HPI:   New rash: Last referred to [facility] for skin biopsy   Mycosis Fungoides: Responsive to prednisone prior to HIV dx. Topical triamcinolone ointment   HIV/AIDs: Sees ID and uses Dovato  Nonspecific pulmonary nodules:   Skin lesion with axillary lymphadenopathy in need of axillary U/S and mammogram    PERTINENT  PMH / PSH: ***  Past Medical History:  Diagnosis Date   Asthma     OBJECTIVE:  There were no vitals taken for this visit.  General: NAD, pleasant, able to participate in exam Cardiac: RRR, no murmurs auscultated Respiratory: CTAB, normal WOB Abdomen: soft, non-tender, non-distended, normoactive bowel sounds Extremities: warm and well perfused, no edema or cyanosis Skin: warm and dry, no rashes noted Neuro: alert, no obvious focal deficits, speech normal Psych: Normal affect and mood  ASSESSMENT/PLAN:  No problem-specific Assessment & Plan notes found for this encounter.   No orders of the defined types were placed in this encounter.  No orders of the defined types were placed in this encounter.  No follow-ups on file. Ashley Emery, MD PGY-2 Family Medicine  {    This will disappear when note is signed, click to select method of visit    :1}

## 2022-03-26 ENCOUNTER — Ambulatory Visit: Payer: 59

## 2022-03-26 ENCOUNTER — Encounter: Payer: BLUE CROSS/BLUE SHIELD | Admitting: Family

## 2022-03-27 ENCOUNTER — Other Ambulatory Visit: Payer: 59

## 2022-03-27 ENCOUNTER — Ambulatory Visit: Payer: 59 | Admitting: Hematology

## 2022-03-28 ENCOUNTER — Encounter: Payer: BLUE CROSS/BLUE SHIELD | Admitting: Family

## 2022-03-28 ENCOUNTER — Ambulatory Visit: Payer: 59

## 2022-04-02 ENCOUNTER — Ambulatory Visit (INDEPENDENT_AMBULATORY_CARE_PROVIDER_SITE_OTHER): Payer: 59 | Admitting: Family Medicine

## 2022-04-02 ENCOUNTER — Encounter: Payer: Self-pay | Admitting: Family Medicine

## 2022-04-02 ENCOUNTER — Other Ambulatory Visit: Payer: Self-pay

## 2022-04-02 DIAGNOSIS — Z0000 Encounter for general adult medical examination without abnormal findings: Secondary | ICD-10-CM | POA: Diagnosis not present

## 2022-04-02 NOTE — Assessment & Plan Note (Signed)
Plans to have Depot shot and pap smear in August. Pt plans to make appointment for mammogram. Also advised to make appointment for axillary U/S.

## 2022-04-02 NOTE — Progress Notes (Signed)
    SUBJECTIVE:   CHIEF COMPLAINT / HPI: BC f/u  [place] is a 41yo F with PMHx of HIV (undetectable) and mycosis fungoides that p/f f/u of BC. Pt does report issues with Depot shots. Pt has not scheduled a mammogram or axillary U/S yet, but plans to soon. Has f/u soon with ID, following for HIV. Has derm appt in December for mycosis fungoides.    PERTINENT  PMH / PSH: as above  OBJECTIVE:   BP 120/82   Pulse 94   Wt 125 lb 3.2 oz (56.8 kg)   SpO2 100%   BMI 22.53 kg/m   Gen: Well appearing, friendly woman HEENT: NCAT, sclera anicteric Pulm: Normal WOB Skin: Papules scattered on face  ASSESSMENT/PLAN:   Healthcare maintenance Plans to have Depot shot and pap smear in August. Pt plans to make appointment for mammogram. Also advised to make appointment for axillary U/S.   Arlyce Dice, MD [place]

## 2022-04-02 NOTE — Patient Instructions (Signed)
Good to see you today - Thank you for coming in  Things we discussed today:  1) Birth Control: You will due for your next Depot shot in August 10-24.  2) Pap Smear: You are due for a pap smear. Schedule one at your earliest convenience.   Come back Aug 10-24 for a Depot shot.

## 2022-04-06 ENCOUNTER — Other Ambulatory Visit: Payer: Self-pay

## 2022-04-06 ENCOUNTER — Ambulatory Visit (INDEPENDENT_AMBULATORY_CARE_PROVIDER_SITE_OTHER): Payer: Self-pay | Admitting: Family

## 2022-04-06 ENCOUNTER — Ambulatory Visit: Payer: 59

## 2022-04-06 ENCOUNTER — Encounter: Payer: Self-pay | Admitting: Family

## 2022-04-06 VITALS — BP 137/79 | HR 83 | Temp 98.4°F | Ht 62.5 in | Wt 124.0 lb

## 2022-04-06 DIAGNOSIS — Z0000 Encounter for general adult medical examination without abnormal findings: Secondary | ICD-10-CM

## 2022-04-06 DIAGNOSIS — B20 Human immunodeficiency virus [HIV] disease: Secondary | ICD-10-CM

## 2022-04-06 MED ORDER — DOVATO 50-300 MG PO TABS: 1.0000 | ORAL_TABLET | Freq: Every day | ORAL | 5 refills | Status: DC

## 2022-04-06 NOTE — Progress Notes (Signed)
Brief Narrative   Patient ID: Ashley Barber, female    DOB: [birth year], 41 y.o.   MRN: [id number]  Ashley Barber is a 41 y/o AA female diagnosed with HIV disease on 08/01/21 with risk factor of heterosexual contact. Initial viral load of 1.21 million with CD4 count of 235. Genotype with no significant medication resistance mutations. [id number] negative. No history of opportunistic infection. Enters care in [facility] Stage 2. Sole ART regimen of Biktarvy to start and transitioned to Blyn.   Subjective:    Chief Complaint  Patient presents with   Follow-up    B20     HPI:  Ashley Barber is a 41 y.o. female with HIV disease last seen on 11/30/21 with well controlled virus and good adherence and tolerance to her ART regimen of Dovato. Continued to have a facial rash. Viral load was undetectable and CD4 count 256. Has been seen by Oncology and had been off medication for 2 months at that point. Here today for routine follow up.  Ashley Barber stopped taking her medication for a period of time to see if it helped with her facial rash with minimal improvement so she restarted taking her medication and has not noted any worsening. Feeling well today with no new concerns/complaints. Going on vacation next week to [place]. Denies fevers, chills, night sweats, headaches, changes in vision, neck pain/stiffness, nausea, diarrhea, vomiting, or lesions.   No problems obtaining medication from the pharmacy. Denies feelings of being down, depressed or hopeless.  Drinks alcohol and smoked marijuana on occasion with daily tobacco use. Condoms offered. Due for pneumococcal vaccination. Has appointment with Dermatology in December.    No Known Allergies    Outpatient Medications Prior to Visit  Medication Sig Dispense Refill   diclofenac Sodium (VOLTAREN) 1 % GEL Apply 2 g topically 4 (four) times daily. 50 g 3   famotidine (PEPCID) 40 MG tablet Take 1 tablet (40 mg total) by mouth daily. 30 tablet 0    hydrOXYzine (VISTARIL) 50 MG capsule Take 1 capsule (50 mg total) by mouth 3 (three) times daily as needed. 30 capsule 0   levocetirizine (XYZAL) 5 MG tablet Take 1 tablet (5 mg total) by mouth every evening. 30 tablet 0   dolutegravir-lamiVUDine (DOVATO) 50-300 MG tablet Take 1 tablet by mouth daily. 30 tablet 4   permethrin (ELIMITE) 5 % cream Apply at bedtime from neck down, washing off in the morning. One-time use. 60 g 0   No facility-administered medications prior to visit.     Past Medical History:  Diagnosis Date   Asthma    HIV disease ([place]) 08/01/2021   Mycosis fungoides ([place]) 08/11/2020     Past Surgical History:  Procedure Laterality Date   HAND SURGERY        Review of Systems  Constitutional:  Negative for appetite change, chills, diaphoresis, fatigue, fever and unexpected weight change.  Eyes:        Negative for acute change in vision  Respiratory:  Negative for chest tightness, shortness of breath and wheezing.   Cardiovascular:  Negative for chest pain.  Gastrointestinal:  Negative for diarrhea, nausea and vomiting.  Genitourinary:  Negative for dysuria, pelvic pain and vaginal discharge.  Musculoskeletal:  Negative for neck pain and neck stiffness.  Skin:  Negative for rash.  Neurological:  Negative for seizures, syncope, weakness and headaches.  Hematological:  Negative for adenopathy. Does not bruise/bleed easily.  Psychiatric/Behavioral:  Negative for hallucinations.  Objective:    BP 137/79   Pulse 83   Temp 98.4 F (36.9 C) (Oral)   Ht 5' 2.5" (1.588 m)   Wt 124 lb (56.2 kg)   BMI 22.32 kg/m  Nursing note and vital signs reviewed.  Physical Exam Constitutional:      General: She is not in acute distress.    Appearance: She is well-developed.  Eyes:     Conjunctiva/sclera: Conjunctivae normal.  Cardiovascular:     Rate and Rhythm: Normal rate and regular rhythm.     Heart sounds: Normal heart sounds. No murmur heard.    No  friction rub. No gallop.  Pulmonary:     Effort: Pulmonary effort is normal. No respiratory distress.     Breath sounds: Normal breath sounds. No wheezing or rales.  Chest:     Chest wall: No tenderness.  Abdominal:     General: Bowel sounds are normal.     Palpations: Abdomen is soft.     Tenderness: There is no abdominal tenderness.  Musculoskeletal:     Cervical back: Neck supple.  Lymphadenopathy:     Cervical: No cervical adenopathy.  Skin:    General: Skin is warm and dry.     Findings: No rash.  Neurological:     Mental Status: She is alert and oriented to person, place, and time.  Psychiatric:        Behavior: Behavior normal.        Thought Content: Thought content normal.        Judgment: Judgment normal.         04/06/2022   11:35 AM 04/02/2022    2:02 PM 10/25/2021    4:01 PM 08/04/2021   10:32 AM 08/01/2021    9:10 AM  Depression screen PHQ 2/9  Decreased Interest 0 0 0 0 0  Down, Depressed, Hopeless 0 0 0 0 0  PHQ - 2 Score 0 0 0 0 0  Altered sleeping 0 0     Tired, decreased energy 0 0     Change in appetite 0 0     Feeling bad or failure about yourself  0 0     Trouble concentrating 0 0     Moving slowly or fidgety/restless 0 0     Suicidal thoughts 0 0     PHQ-9 Score 0 0          Assessment & Plan:    Patient Active Problem List   Diagnosis Date Noted   Healthcare maintenance 09/07/2021   HIV disease ([place]) 08/01/2021   Abscess 07/13/2021   Encounter for management and injection of depo-Provera 02/25/2021   Biological false positive RPR test 01/09/2021   Mycosis fungoides ([place]) 08/11/2020     Problem List Items Addressed This Visit       Other   HIV disease ([place]) - Primary    Ashley Barber took a medication holiday to see if there were improvements in her rash and does not appear to be related to the medication and has been taking daily since. Reviewed previous lab work and discussed plan of care. Check lab work today. Continue current dose  of Dovato. Plan for follow up in 4 months of sooner if needed.       Relevant Medications   dolutegravir-lamiVUDine (DOVATO) 50-300 MG tablet   Other Relevant Orders   Comprehensive metabolic panel   HIV-1 RNA quant-no reflex-bld   T-helper cells (CD4) count (not at [facility])   Healthcare maintenance  Discussed importance of safe sexual practice and condom use. Condoms offered. Declines vaccination.  Encouraged to complete routine dental care and will complete independently.  Due for cervical cancer screening.        I have discontinued Ashley Barber. Mifflin's permethrin. I am also having her maintain her diclofenac Sodium, levocetirizine, famotidine, hydrOXYzine, and Dovato.   Meds ordered this encounter  Medications   dolutegravir-lamiVUDine (DOVATO) 50-300 MG tablet    Sig: Take 1 tablet by mouth daily.    Dispense:  30 tablet    Refill:  5    Order Specific Question:   Supervising Provider    Answer:   Carlyle Basques [[id number]]     Follow-up: Return in about 4 months (around 08/06/2022), or if symptoms worsen or fail to improve.   Terri Piedra, MSN, FNP-C Nurse Practitioner [facility] [place] number: [phone]

## 2022-04-06 NOTE — Assessment & Plan Note (Signed)
Ashley Barber took a medication holiday to see if there were improvements in her rash and does not appear to be related to the medication and has been taking daily since. Reviewed previous lab work and discussed plan of care. Check lab work today. Continue current dose of Dovato. Plan for follow up in 4 months of sooner if needed.

## 2022-04-06 NOTE — Patient Instructions (Signed)
Nice to see you.  We will check your lab work today.  Continue to take your medication daily as prescribed.  Refills have been sent to the pharmacy.  Plan for follow up in 4 months or sooner if needed with lab work on the same day.  Have a great day and stay safe!  

## 2022-04-06 NOTE — Assessment & Plan Note (Signed)
   Discussed importance of safe sexual practice and condom use. Condoms offered.  Declines vaccination.   Encouraged to complete routine dental care and will complete independently.   Due for cervical cancer screening.

## 2022-04-09 LAB — COMPREHENSIVE METABOLIC PANEL
AG Ratio: 0.7 (calc) — ABNORMAL LOW (ref 1.0–2.5)
ALT: 7 U/L (ref 6–29)
AST: 19 U/L (ref 10–30)
Albumin: 3.7 g/dL (ref 3.6–5.1)
Alkaline phosphatase (APISO): 64 U/L (ref 31–125)
BUN: 7 mg/dL (ref 7–25)
CO2: 28 mmol/L (ref 20–32)
Calcium: 9.2 mg/dL (ref 8.6–10.2)
Chloride: 106 mmol/L (ref 98–110)
Creat: 0.71 mg/dL (ref 0.50–0.99)
Globulin: 5.2 g/dL (calc) — ABNORMAL HIGH (ref 1.9–3.7)
Glucose, Bld: 109 mg/dL — ABNORMAL HIGH (ref 65–99)
Potassium: 3.9 mmol/L (ref 3.5–5.3)
Sodium: 138 mmol/L (ref 135–146)
Total Bilirubin: 0.3 mg/dL (ref 0.2–1.2)
Total Protein: 8.9 g/dL — ABNORMAL HIGH (ref 6.1–8.1)

## 2022-04-09 LAB — HIV-1 RNA QUANT-NO REFLEX-BLD
HIV 1 RNA Quant: NOT DETECTED Copies/mL
HIV-1 RNA Quant, Log: NOT DETECTED Log cps/mL

## 2022-04-09 LAB — T-HELPER CELLS (CD4) COUNT (NOT AT ARMC)
Absolute CD4: 222 cells/uL — ABNORMAL LOW (ref 490–1740)
CD4 T Helper %: 22 % — ABNORMAL LOW (ref 30–61)
Total lymphocyte count: 1027 cells/uL (ref 850–3900)

## 2022-04-12 ENCOUNTER — Telehealth: Payer: Self-pay | Admitting: *Deleted

## 2022-04-12 NOTE — Telephone Encounter (Signed)
Patient called - contacted by [facility] in a mychart message that office is closing and her appt is cancelled. Contacted patient to inform that referral being sent to [facility]. Gave patient practice number for her f/u.  [facility] office [phone]: LVM on New patient appt line with patient name/dob, referring MD name, and patient and referring MD contact info.  Faxed original referral packet with demographics, OV notes and labs to Referral Coordinator [phone] and to Clinical Fax [phone]. Fax confirmation received.

## 2022-04-16 ENCOUNTER — Ambulatory Visit (INDEPENDENT_AMBULATORY_CARE_PROVIDER_SITE_OTHER): Payer: 59

## 2022-04-16 DIAGNOSIS — Z3042 Encounter for surveillance of injectable contraceptive: Secondary | ICD-10-CM

## 2022-04-16 MED ORDER — MEDROXYPROGESTERONE ACETATE 150 MG/ML IM SUSP
150.0000 mg | Freq: Once | INTRAMUSCULAR | Status: AC
  Administered 2022-04-16: 150 mg via INTRAMUSCULAR

## 2022-04-16 NOTE — Progress Notes (Signed)
Patient here today for Depo Provera injection and is within her dates.    Last contraceptive appt was 01/25/2022  Depo given in [place] today.  Site unremarkable & patient tolerated injection.    Next injection due 07/02/22-07/16/22.  Reminder card given.    Talbot Grumbling, RN

## 2022-04-20 ENCOUNTER — Inpatient Hospital Stay: Payer: 59 | Admitting: Hematology

## 2022-06-19 ENCOUNTER — Ambulatory Visit: Payer: Medicaid Other

## 2022-06-19 ENCOUNTER — Telehealth: Payer: Self-pay

## 2022-06-19 ENCOUNTER — Other Ambulatory Visit (HOSPITAL_COMMUNITY): Payer: Self-pay

## 2022-06-19 NOTE — Telephone Encounter (Signed)
Patient called stating she was not able to pick up her medication at the pharmacy due to ADAP expired. Patient scheduled for financial visit today and will receive medication samples. Patient also scheduled for labs and follow up visit in Dec as recommended per last office visit.   Ashley Barber

## 2022-06-19 NOTE — Telephone Encounter (Signed)
Patient have insurance .

## 2022-06-19 NOTE — Telephone Encounter (Signed)
Thank you :)

## 2022-07-03 ENCOUNTER — Ambulatory Visit (INDEPENDENT_AMBULATORY_CARE_PROVIDER_SITE_OTHER): Payer: No Typology Code available for payment source | Admitting: Student

## 2022-07-03 VITALS — BP 114/68 | HR 88 | Ht 62.5 in | Wt 125.6 lb

## 2022-07-03 DIAGNOSIS — R300 Dysuria: Secondary | ICD-10-CM

## 2022-07-03 DIAGNOSIS — Z3042 Encounter for surveillance of injectable contraceptive: Secondary | ICD-10-CM | POA: Diagnosis not present

## 2022-07-03 LAB — POCT URINALYSIS DIP (MANUAL ENTRY)
Bilirubin, UA: NEGATIVE
Glucose, UA: NEGATIVE mg/dL
Nitrite, UA: NEGATIVE
Spec Grav, UA: 1.01 (ref 1.010–1.025)
Urobilinogen, UA: 0.2 E.U./dL
pH, UA: 7 (ref 5.0–8.0)

## 2022-07-03 LAB — POCT UA - MICROSCOPIC ONLY: WBC, Ur, HPF, POC: >20 (ref 0–5)

## 2022-07-03 MED ORDER — CEPHALEXIN 500 MG PO CAPS: 500.0000 mg | ORAL_CAPSULE | Freq: Two times a day (BID) | ORAL | 0 refills | Status: AC

## 2022-07-03 MED ORDER — MEDROXYPROGESTERONE ACETATE 150 MG/ML IM SUSP
150.0000 mg | Freq: Once | INTRAMUSCULAR | Status: AC
  Administered 2022-07-03: 150 mg via INTRAMUSCULAR

## 2022-07-03 NOTE — Progress Notes (Signed)
Patient here today for Depo Provera injection and is within her dates.    Last contraceptive appt was 04/16/2022  Depo given in [place] today.  Site unremarkable & patient tolerated injection.    Next injection due Jan 16-Oct 02, 2021.  Reminder card given.    Ottis Stain, CMA

## 2022-07-03 NOTE — Progress Notes (Signed)
    SUBJECTIVE:   CHIEF COMPLAINT / HPI:   41 y.o.  year old female presents with complaint of irritation with voiding that started 3 days ago.  She also endorses increased frequency and urgency.  Denies vaginal discharge, itchiness or odor.  Reports mild hematuria or recent change in medication. She is not sexually active Don't have periods because she's on depot shot. Occasional spotting with last being 2 months    PERTINENT  PMH / PSH: Reviewed  OBJECTIVE:   BP (!) 135/98   Pulse 88   Ht 5' 2.5" (1.588 m)   Wt 125 lb 9.6 oz (57 kg)   SpO2 98%   BMI 22.61 kg/m    Physical Exam General: Alert, well appearing, NAD Cardiovascular: RRR, No Murmurs, Normal S2/S2 Respiratory: CTAB, No wheezing or Rales Abdomen: No distension or tenderness MSK: No CVA tenderness  ASSESSMENT/PLAN:   Dysuria Patient complaining of dysuria and increased urinary frequency in the last 3 days with suprapubic pain.  POCT dipstick was consistent with UTI.  Started patient on Keflex 500 mg twice daily for 7  days.  Contraceptive Patient received her Depo injection today.  Which was administered by CMA Hilarie Fredrickson, MD [place]

## 2022-07-03 NOTE — Patient Instructions (Signed)
It was wonderful to meeting you today. Thank you for allowing me to be a part of your care. Below is a short summary of what we discussed at your visit today:  Your urine test was positive for UTI.  I have placed order for Keflex which you will take twice daily for 7 days.   Please bring all of your medications to every appointment!  If you have any questions or concerns, please do not hesitate to contact us via phone or MyChart message.   Alen Bleacher, MD [facility]

## 2022-08-10 ENCOUNTER — Other Ambulatory Visit: Payer: Self-pay

## 2022-08-10 DIAGNOSIS — Z113 Encounter for screening for infections with a predominantly sexual mode of transmission: Secondary | ICD-10-CM

## 2022-08-10 DIAGNOSIS — B20 Human immunodeficiency virus [HIV] disease: Secondary | ICD-10-CM

## 2022-08-10 DIAGNOSIS — Z79899 Other long term (current) drug therapy: Secondary | ICD-10-CM

## 2022-08-15 ENCOUNTER — Other Ambulatory Visit: Payer: Self-pay

## 2022-08-15 ENCOUNTER — Ambulatory Visit: Payer: No Typology Code available for payment source

## 2022-08-15 ENCOUNTER — Other Ambulatory Visit (HOSPITAL_COMMUNITY)
Admission: RE | Admit: 2022-08-15 | Discharge: 2022-08-15 | Disposition: A | Discharge status: Home / Self Care | Payer: No Typology Code available for payment source | Source: Ambulatory Visit | Attending: Family | Admitting: Family

## 2022-08-15 ENCOUNTER — Other Ambulatory Visit: Payer: No Typology Code available for payment source

## 2022-08-15 DIAGNOSIS — B20 Human immunodeficiency virus [HIV] disease: Secondary | ICD-10-CM | POA: Diagnosis not present

## 2022-08-15 DIAGNOSIS — Z79899 Other long term (current) drug therapy: Secondary | ICD-10-CM | POA: Insufficient documentation

## 2022-08-15 DIAGNOSIS — Z7251 High risk heterosexual behavior: Secondary | ICD-10-CM

## 2022-08-15 DIAGNOSIS — Z113 Encounter for screening for infections with a predominantly sexual mode of transmission: Secondary | ICD-10-CM

## 2022-08-16 LAB — T-HELPER CELL (CD4) - (RCID CLINIC ONLY)
CD4 % Helper T Cell: 17 % — ABNORMAL LOW (ref 33–65)
CD4 T Cell Abs: 227 /uL — ABNORMAL LOW (ref 400–1790)

## 2022-08-16 LAB — URINE CYTOLOGY ANCILLARY ONLY
Chlamydia: NEGATIVE
Comment: NEGATIVE
Comment: NORMAL
Neisseria Gonorrhea: NEGATIVE

## 2022-08-18 LAB — CBC WITH DIFFERENTIAL/PLATELET
Absolute Monocytes: 532 cells/uL (ref 200–950)
Basophils Absolute: 30 cells/uL (ref 0–200)
Basophils Relative: 0.4 %
Eosinophils Absolute: 68 cells/uL (ref 15–500)
Eosinophils Relative: 0.9 %
HCT: 39.4 % (ref 35.0–45.0)
Hemoglobin: 13.9 g/dL (ref 11.7–15.5)
Lymphs Abs: 1376 cells/uL (ref 850–3900)
MCH: 33.7 pg — ABNORMAL HIGH (ref 27.0–33.0)
MCHC: 35.3 g/dL (ref 32.0–36.0)
MCV: 95.4 fL (ref 80.0–100.0)
MPV: 9.3 fL (ref 7.5–12.5)
Monocytes Relative: 7 %
Neutro Abs: 5594 cells/uL (ref 1500–7800)
Neutrophils Relative %: 73.6 %
Platelets: 307 10*3/uL (ref 140–400)
RBC: 4.13 10*6/uL (ref 3.80–5.10)
RDW: 14 % (ref 11.0–15.0)
Total Lymphocyte: 18.1 %
WBC: 7.6 10*3/uL (ref 3.8–10.8)

## 2022-08-18 LAB — COMPLETE METABOLIC PANEL WITH GFR
AG Ratio: 0.8 (calc) — ABNORMAL LOW (ref 1.0–2.5)
ALT: 7 U/L (ref 6–29)
AST: 19 U/L (ref 10–30)
Albumin: 3.8 g/dL (ref 3.6–5.1)
Alkaline phosphatase (APISO): 59 U/L (ref 31–125)
BUN/Creatinine Ratio: 4 (calc) — ABNORMAL LOW (ref 6–22)
BUN: 3 mg/dL — ABNORMAL LOW (ref 7–25)
CO2: 24 mmol/L (ref 20–32)
Calcium: 9 mg/dL (ref 8.6–10.2)
Chloride: 106 mmol/L (ref 98–110)
Creat: 0.75 mg/dL (ref 0.50–0.99)
Globulin: 4.5 g/dL (calc) — ABNORMAL HIGH (ref 1.9–3.7)
Glucose, Bld: 116 mg/dL — ABNORMAL HIGH (ref 65–99)
Potassium: 4 mmol/L (ref 3.5–5.3)
Sodium: 138 mmol/L (ref 135–146)
Total Bilirubin: 0.3 mg/dL (ref 0.2–1.2)
Total Protein: 8.3 g/dL — ABNORMAL HIGH (ref 6.1–8.1)
eGFR: 103 mL/min/{1.73_m2} (ref >=60)

## 2022-08-18 LAB — LIPID PANEL
Cholesterol: 219 mg/dL — ABNORMAL HIGH (ref <200)
HDL: 59 mg/dL (ref >=50)
LDL Cholesterol (Calc): 137 mg/dL (calc) — ABNORMAL HIGH
Non-HDL Cholesterol (Calc): 160 mg/dL (calc) — ABNORMAL HIGH (ref <130)
Total CHOL/HDL Ratio: 3.7 (calc) (ref <5.0)
Triglycerides: 113 mg/dL (ref <150)

## 2022-08-18 LAB — HIV-1 RNA QUANT-NO REFLEX-BLD
HIV 1 RNA Quant: NOT DETECTED Copies/mL
HIV-1 RNA Quant, Log: NOT DETECTED Log cps/mL

## 2022-08-18 LAB — RPR: RPR Ser Ql: NONREACTIVE

## 2022-08-21 ENCOUNTER — Ambulatory Visit: Payer: BLUE CROSS/BLUE SHIELD | Admitting: Physician Assistant

## 2022-08-29 ENCOUNTER — Ambulatory Visit: Payer: Medicaid Other | Admitting: Family

## 2022-09-04 ENCOUNTER — Other Ambulatory Visit (HOSPITAL_COMMUNITY): Payer: Self-pay

## 2022-09-04 NOTE — Telephone Encounter (Signed)
Patient have [employer] [place] Medicaid Management . Her copay is $0.00

## 2022-09-10 ENCOUNTER — Other Ambulatory Visit: Payer: Self-pay

## 2022-09-10 MED ORDER — DOVATO 50-300 MG PO TABS: 1.0000 | ORAL_TABLET | Freq: Every day | ORAL | 0 refills | Status: DC

## 2022-09-26 ENCOUNTER — Ambulatory Visit: Payer: Medicaid Other | Admitting: Family

## 2022-10-09 ENCOUNTER — Other Ambulatory Visit: Payer: Self-pay | Admitting: Family

## 2022-10-25 ENCOUNTER — Other Ambulatory Visit: Payer: Self-pay

## 2022-10-25 ENCOUNTER — Ambulatory Visit (INDEPENDENT_AMBULATORY_CARE_PROVIDER_SITE_OTHER): Payer: Medicaid Other | Admitting: Family

## 2022-10-25 ENCOUNTER — Encounter: Payer: Self-pay | Admitting: Family

## 2022-10-25 VITALS — BP 151/92 | HR 93 | Temp 98.4°F | Wt 131.0 lb

## 2022-10-25 DIAGNOSIS — Z0000 Encounter for general adult medical examination without abnormal findings: Secondary | ICD-10-CM

## 2022-10-25 DIAGNOSIS — Z9189 Other specified personal risk factors, not elsewhere classified: Secondary | ICD-10-CM

## 2022-10-25 DIAGNOSIS — B20 Human immunodeficiency virus [HIV] disease: Secondary | ICD-10-CM | POA: Diagnosis not present

## 2022-10-25 MED ORDER — DOVATO 50-300 MG PO TABS: 1.0000 | ORAL_TABLET | Freq: Every day | ORAL | 5 refills | Status: DC

## 2022-10-25 NOTE — Progress Notes (Signed)
Brief Narrative   Patient ID: Ashley Barber, female    DOB: 12/02/1980, 42 y.o.   MRN: [id number]  Ashley Barber is a 42 y/o AA female diagnosed with HIV disease on 08/01/21 with risk factor of heterosexual contact. Initial viral load of 1.21 million with CD4 count of 235. Genotype with no significant medication resistance mutations. [id number] negative. No history of opportunistic infection. Enters care in CDC Stage 2. Sole ART regimen of Biktarvy to start and transitioned to [place].    Subjective:    Chief Complaint  Patient presents with   HIV Positive/AIDS    HPI:  Ashley Barber is a 42 y.o. female with HIV disease last seen on 04/06/22 with well controlled virus and good adherence and tolerance to Dovato. Viral load was undetectable and CD4 count 222. Renal function, hepatic function and electrolytes within normal ranges. Most recent lab work completed on 08/15/22 with viral load that remains undetectable and CD4 count 227. STD testing was negative and renal function, hepatic function and electrolytes were normal. Lipid profile with LDL 137, HDL 59 and triglycerides 113. ASCVD risk is 2%. Here today for follow up.   Ashley Barber has been doing well since her last visit. She is living in a new home and is currently working with her sister to take care of her mother. Continues to take Dovato daily as prescribed with no adverse side effects or problems obtaining medication from the pharmacy. Now has coverage through Medicaid. No new concerns/complaints. Condoms and STD testing offered.  Denies fevers, chills, night sweats, headaches, changes in vision, neck pain/stiffness, nausea, diarrhea, vomiting, lesions or rashes.   No Known Allergies    Outpatient Medications Prior to Visit  Medication Sig Dispense Refill   hydrOXYzine (VISTARIL) 50 MG capsule Take 1 capsule (50 mg total) by mouth 3 (three) times daily as needed. 30 capsule 0   levocetirizine (XYZAL) 5 MG tablet Take 1 tablet (5  mg total) by mouth every evening. 30 tablet 0   DOVATO 50-300 MG tablet TAKE 1 TABLET BY MOUTH DAILY 30 tablet 0   diclofenac Sodium (VOLTAREN) 1 % GEL Apply 2 g topically 4 (four) times daily. (Patient not taking: Reported on 10/25/2022) 50 g 3   famotidine (PEPCID) 40 MG tablet Take 1 tablet (40 mg total) by mouth daily. (Patient not taking: Reported on 10/25/2022) 30 tablet 0   No facility-administered medications prior to visit.     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  Eyes:        Negative for acute change in vision  Respiratory:  Negative for chest tightness, shortness of breath and wheezing.   Cardiovascular:  Negative for chest pain.  Gastrointestinal:  Negative for diarrhea, nausea and vomiting.  Genitourinary:  Negative for dysuria, pelvic pain and vaginal discharge.  Musculoskeletal:  Negative for neck pain and neck stiffness.  Skin:  Negative for rash.  Neurological:  Negative for seizures, syncope, weakness and headaches.  Hematological:  Negative for adenopathy. Does not bruise/bleed easily.  Psychiatric/Behavioral:  Negative for hallucinations.       Objective:    BP (!) 151/92   Pulse 93  Temp 98.4 F (36.9 C) (Oral)   Wt 131 lb (59.4 kg)   SpO2 97%   BMI 23.58 kg/m  Nursing note and vital signs reviewed.  Physical Exam Constitutional:      General: She is not in acute distress.    Appearance: She is well-developed.  Eyes:     Conjunctiva/sclera: Conjunctivae normal.  Cardiovascular:     Rate and Rhythm: Normal rate and regular rhythm.     Heart sounds: Normal heart sounds. No murmur heard.    No friction rub. No gallop.  Pulmonary:     Effort: Pulmonary  effort is normal. No respiratory distress.     Breath sounds: Normal breath sounds. No wheezing or rales.  Chest:     Chest wall: No tenderness.  Abdominal:     General: Bowel sounds are normal.     Palpations: Abdomen is soft.     Tenderness: There is no abdominal tenderness.  Musculoskeletal:     Cervical back: Neck supple.  Lymphadenopathy:     Cervical: No cervical adenopathy.  Skin:    General: Skin is warm and dry.     Findings: No rash.  Neurological:     Mental Status: She is alert and oriented to person, place, and time.  Psychiatric:        Behavior: Behavior normal.        Thought Content: Thought content normal.        Judgment: Judgment normal.         07/03/2022   11:08 AM 04/06/2022   11:35 AM 04/02/2022    2:02 PM 10/25/2021    4:01 PM 08/04/2021   10:32 AM  Depression screen PHQ 2/9  Decreased Interest 1 0 0 0 0  Down, Depressed, Hopeless 2 0 0 0 0  PHQ - 2 Score 3 0 0 0 0  Altered sleeping 3 0 0    Tired, decreased energy 0 0 0    Change in appetite 0 0 0    Feeling bad or failure about yourself  3 0 0    Trouble concentrating 0 0 0    Moving slowly or fidgety/restless 0 0 0    Suicidal thoughts 0 0 0    PHQ-9 Score 9 0 0    Difficult doing work/chores Somewhat difficult           Assessment & Plan:    Patient Active Problem List   Diagnosis Date Noted   Healthcare maintenance 09/07/2021   HIV disease ([place]) 08/01/2021   Abscess 07/13/2021   Encounter for management and injection of depo-Provera 02/25/2021   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 continues to have well controlled virus with good adherence and tolerance to Dovato. Reviewed previous lab work and discussed plan of care. Declined lab work today. Continue current dose of Dovato. Plan for follow up in 4 months or sooner if needed with lab work 1-2  weeks prior to appointment.       Relevant Medications   dolutegravir-lamiVUDine (DOVATO) 50-300 MG tablet   Other Relevant Orders   COMPLETE METABOLIC PANEL WITH GFR   HIV-1 RNA quant-no reflex-bld   T-helper cell (CD4)- ([facility] clinic only)   Healthcare maintenance    Discussed importance of safe sexual practice and condom use. Condoms and STD  testing offered.  Referral sent for mammogram. Last pap smear last year.       Other Visit Diagnoses     At risk for breast cancer    -  Primary   Relevant Orders   MM Digital Screening        I have discontinued William Hamburger. Lambe's diclofenac Sodium and famotidine. I have also changed her Dovato. Additionally, I am having her maintain her levocetirizine and hydrOXYzine.   Meds ordered this encounter  Medications   dolutegravir-lamiVUDine (DOVATO) 50-300 MG tablet    Sig: Take 1 tablet by mouth daily.    Dispense:  30 tablet    Refill:  5    Order Specific Question:   Supervising Provider    Answer:   Carlyle Basques [[id number]]     Follow-up: Return in about 4 months (around 02/23/2023), or if symptoms worsen or fail to improve.   Terri Piedra, MSN, FNP-C Nurse Practitioner [facility] for [facility]ious [facility] [place] number: [phone]

## 2022-10-25 NOTE — Assessment & Plan Note (Signed)
Discussed importance of safe sexual practice and condom use. Condoms and STD testing offered.  Referral sent for mammogram. Last pap smear last year.

## 2022-10-25 NOTE — Patient Instructions (Addendum)
Nice to see you.  Continue to take your medication daily as prescribed.  Refills have been sent to the pharmacy.  They will call to schedule your mammogram. 4  Plan for follow up in 4 months or sooner if needed with lab work 1-2 weeks prior to appointment.  Have a great day and stay safe!

## 2022-10-25 NOTE — Assessment & Plan Note (Signed)
Ashley Barber continues to have well controlled virus with good adherence and tolerance to Dovato. Reviewed previous lab work and discussed plan of care. Declined lab work today. Continue current dose of Dovato. Plan for follow up in 4 months or sooner if needed with lab work 1-2 weeks prior to appointment.

## 2022-10-26 ENCOUNTER — Other Ambulatory Visit: Payer: Self-pay | Admitting: Pharmacist

## 2022-10-26 DIAGNOSIS — B20 Human immunodeficiency virus [HIV] disease: Secondary | ICD-10-CM

## 2022-10-26 MED ORDER — DOVATO 50-300 MG PO TABS: 1.0000 | ORAL_TABLET | Freq: Every day | ORAL | 0 refills | Status: AC

## 2022-10-26 NOTE — Progress Notes (Signed)
Medication Samples have been provided to the patient.  Drug name: Dovato        Strength: 50/300 mg         Qty: 14  Tablets (1 bottles) LOT: [id number]   Exp.Date: 4/25  Dosing instructions: Take one tablet by mouth once daily  The patient has been instructed regarding the correct time, dose, and frequency of taking this medication, including desired effects and most common side effects.   Alfonse Spruce, PharmD, CPP, BCIDP, [place] Clinical Pharmacist Practitioner [facility]

## 2022-11-09 ENCOUNTER — Ambulatory Visit: Payer: Medicaid Other | Admitting: Student

## 2022-11-09 NOTE — Progress Notes (Deleted)
  SUBJECTIVE:   CHIEF COMPLAINT / HPI:   Returning for Depo shot.  H/o HIV, currently on Dovato.   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 Physical Exam   ASSESSMENT/PLAN:  There are no diagnoses linked to this encounter. No follow-ups on file. [name], MD 11/09/2022, 1:02 PM PGY-***, [facility] {    This will disappear when note is signed, click to select method of visit    :1}

## 2022-11-12 ENCOUNTER — Other Ambulatory Visit: Payer: Self-pay | Admitting: Family

## 2022-11-12 DIAGNOSIS — B20 Human immunodeficiency virus [HIV] disease: Secondary | ICD-10-CM

## 2022-11-29 ENCOUNTER — Encounter: Payer: Self-pay | Admitting: Family Medicine

## 2022-11-29 ENCOUNTER — Ambulatory Visit (INDEPENDENT_AMBULATORY_CARE_PROVIDER_SITE_OTHER): Payer: Medicaid Other | Admitting: Family Medicine

## 2022-11-29 VITALS — BP 121/86 | HR 104 | Ht 62.5 in | Wt 135.0 lb

## 2022-11-29 DIAGNOSIS — Z3042 Encounter for surveillance of injectable contraceptive: Secondary | ICD-10-CM | POA: Diagnosis not present

## 2022-11-29 DIAGNOSIS — Z3009 Encounter for other general counseling and advice on contraception: Secondary | ICD-10-CM

## 2022-11-29 DIAGNOSIS — Z309 Encounter for contraceptive management, unspecified: Secondary | ICD-10-CM | POA: Diagnosis not present

## 2022-11-29 LAB — POCT URINE PREGNANCY: Preg Test, Ur: NEGATIVE

## 2022-11-29 MED ORDER — MEDROXYPROGESTERONE ACETATE 150 MG/ML IM SUSP
150.0000 mg | Freq: Once | INTRAMUSCULAR | Status: AC
  Administered 2022-11-29: 150 mg via INTRAMUSCULAR

## 2022-11-29 NOTE — Assessment & Plan Note (Signed)
Urine pregnancy negative, no backup contraception indicated.  Depo-Provera reinitiated today.  Return every 3 months for Depo-Provera injections.

## 2022-11-29 NOTE — Progress Notes (Signed)
    SUBJECTIVE:   CHIEF COMPLAINT / HPI:  Chief Complaint  Patient presents with   Contraception    Patient is here for reinitiation of Depo-Provera.  She was due for a dose and January of this year, but she forgot about the appointment. Last menstrual period was 3 days ago.  She has not had sexual intercourse since then.  She reports last sexually active over 2 months ago. She reports that she has tolerated Depo-Provera without any issues in the past.  PERTINENT  PMH / PSH: HIV followed by infectious disease  Patient Care Team: [name], MD as PCP - General (Family Medicine)   OBJECTIVE:   BP 121/86   Pulse (!) 104   Ht 5' 2.5" (1.588 m)   Wt 135 lb (61.2 kg)   LMP 11/26/2022   SpO2 100%   BMI 24.30 kg/m   Physical Exam Constitutional:      General: She is not in acute distress. Cardiovascular:     Rate and Rhythm: Normal rate.  Pulmonary:     Effort: Pulmonary effort is normal. No respiratory distress.  Musculoskeletal:     Cervical back: Neck supple.  Neurological:     Mental Status: She is alert.         {Show previous vital signs (optional):23777}    ASSESSMENT/PLAN:   Encounter for management and injection of depo-Provera Urine pregnancy negative, no backup contraception indicated.  Depo-Provera reinitiated today.  Return every 3 months for Depo-Provera injections.    Return in about 3 months (around 03/01/2023) for RN clinic Depo Provera.   Zola Button, MD [place]

## 2022-11-29 NOTE — Patient Instructions (Addendum)
It was nice seeing you today!  Return in 3 months for your next Depo shot.  Stay well, Ashley Button, MD [place] [phone]  --  Make sure to check out at the front desk before you leave today.  Please arrive at least 15 minutes prior to your scheduled appointments.  If you had blood work today, I will send you a MyChart message or a letter if results are normal. Otherwise, I will give you a call.  If you had a referral placed, they will call you to set up an appointment. Please give [place] a call if you don't hear back in the next 2 weeks.  If you need additional refills before your next appointment, please call your pharmacy first.

## 2023-02-07 ENCOUNTER — Other Ambulatory Visit: Payer: Medicaid Other

## 2023-02-21 ENCOUNTER — Encounter: Payer: Medicaid Other | Admitting: Family

## 2023-02-22 ENCOUNTER — Other Ambulatory Visit: Payer: Self-pay

## 2023-02-22 DIAGNOSIS — C8409 Mycosis fungoides, extranodal and solid organ sites: Secondary | ICD-10-CM

## 2023-02-25 ENCOUNTER — Inpatient Hospital Stay: Payer: Medicaid Other | Attending: Hematology

## 2023-02-25 ENCOUNTER — Inpatient Hospital Stay: Payer: Medicaid Other | Admitting: Hematology

## 2023-02-25 ENCOUNTER — Ambulatory Visit (INDEPENDENT_AMBULATORY_CARE_PROVIDER_SITE_OTHER): Payer: Medicaid Other

## 2023-02-25 DIAGNOSIS — Z3042 Encounter for surveillance of injectable contraceptive: Secondary | ICD-10-CM | POA: Diagnosis not present

## 2023-02-25 MED ORDER — MEDROXYPROGESTERONE ACETATE 150 MG/ML IM SUSY
150.0000 mg | PREFILLED_SYRINGE | Freq: Once | INTRAMUSCULAR | Status: AC
Start: 2023-02-25 — End: 2023-02-25
  Administered 2023-02-25: 150 mg via INTRAMUSCULAR

## 2023-02-25 NOTE — Progress Notes (Signed)
Patient here today for Depo Provera injection and is within her dates.    Last contraceptive appt was 11/29/22  Depo given in [place] today.  Site unremarkable & patient tolerated injection.    Next injection due 05/13/23-05/27/23.  Reminder card given.    Veronda Prude, RN

## 2023-03-06 ENCOUNTER — Other Ambulatory Visit: Payer: Medicaid Other

## 2023-03-06 ENCOUNTER — Other Ambulatory Visit: Payer: Self-pay | Admitting: Family

## 2023-03-06 DIAGNOSIS — B20 Human immunodeficiency virus [HIV] disease: Secondary | ICD-10-CM

## 2023-03-14 ENCOUNTER — Ambulatory Visit: Payer: Medicaid Other | Admitting: Family

## 2023-04-10 IMAGING — CT CT CHEST-ABD-PELV W/ CM
2 of 4 series · 13 of 36 positions shown, 15 images · IV contrast (APPLIED)
Comparison: None.

CLINICAL DATA: Newly diagnosed mycosis fungoides, staging

EXAM:
CT CHEST, ABDOMEN, AND PELVIS WITH CONTRAST
TECHNIQUE: Multidetector CT imaging of the chest, abdomen and pelvis was
performed following the standard protocol during bolus
administration of intravenous contrast.
CONTRAST:  80mL OMNIPAQUE IOHEXOL 350 MG/ML SOLN, additional oral
enteric contrast

[Series 2: cap with · axial · 0.64mm/px · z∈[+1185,+1710]mm · 10 of 127 slices shown, 12 images]
[im 11/127  mediastinal]
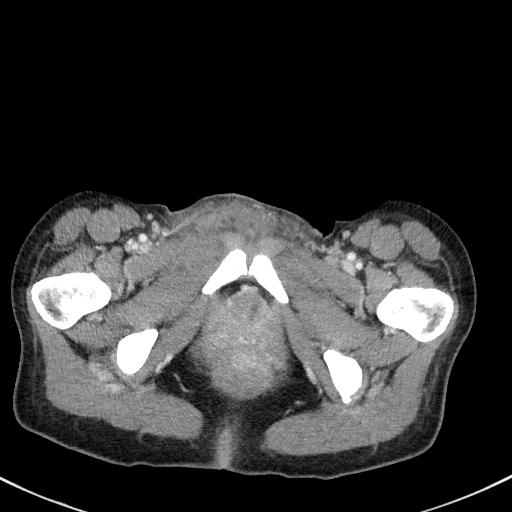
[im 11/127  bone]
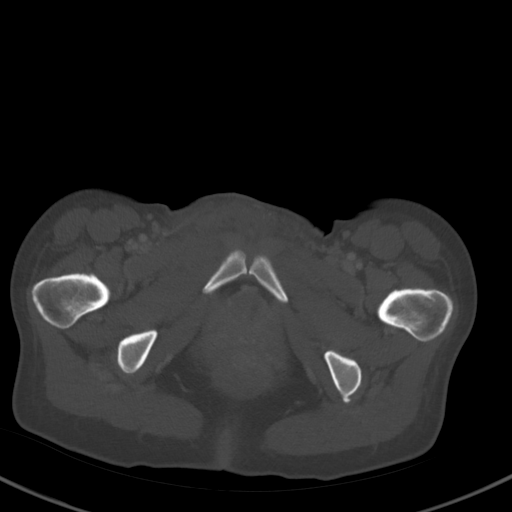
[im 22/127  mediastinal]
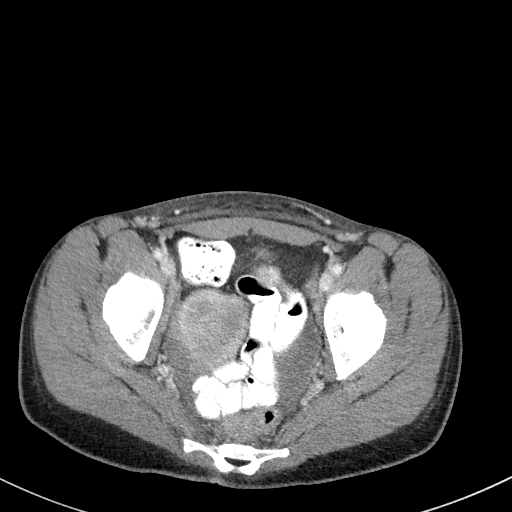
[im 32/127  mediastinal]
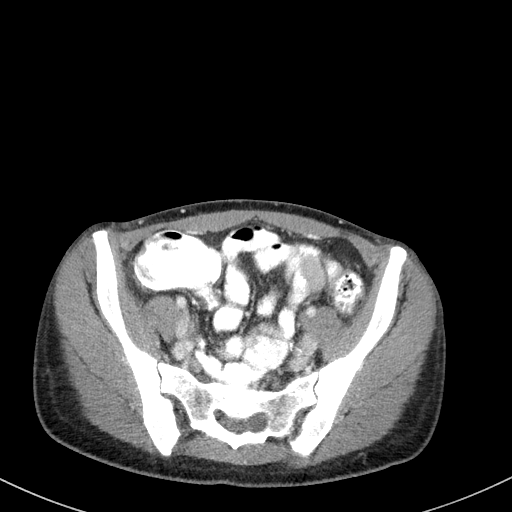
[im 43/127  mediastinal]
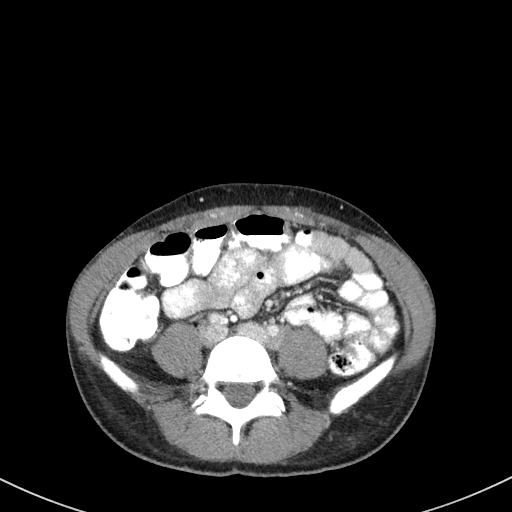
[im 53/127  mediastinal]
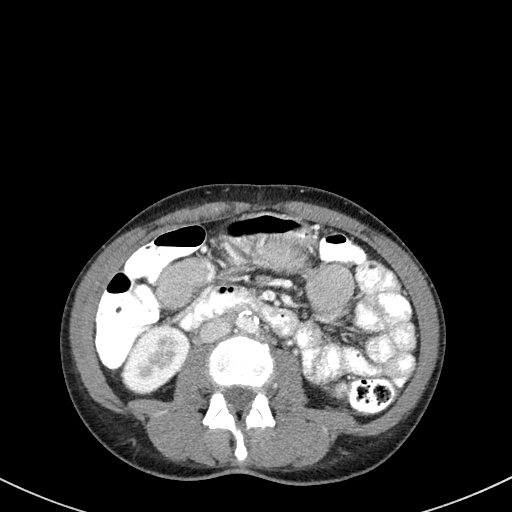
[im 74/127  mediastinal]
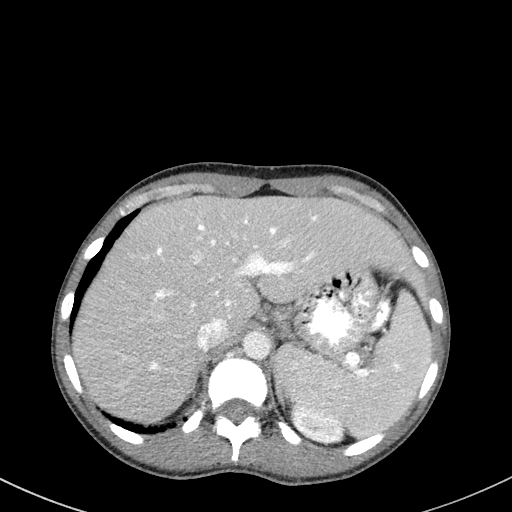
[im 85/127  mediastinal]
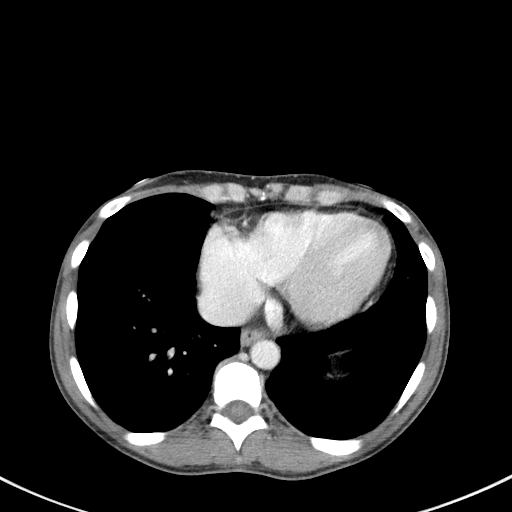
[im 95/127  mediastinal]
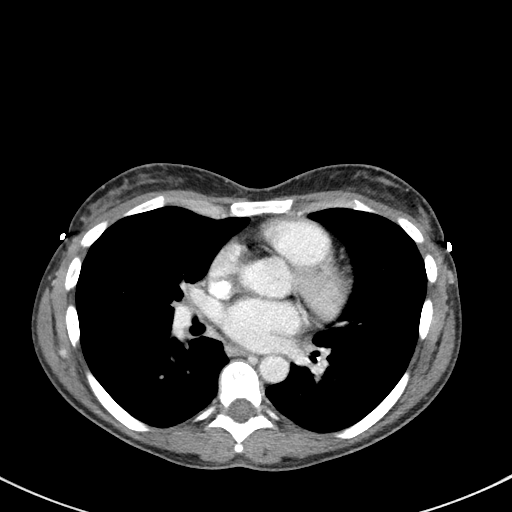
[im 106/127  mediastinal]
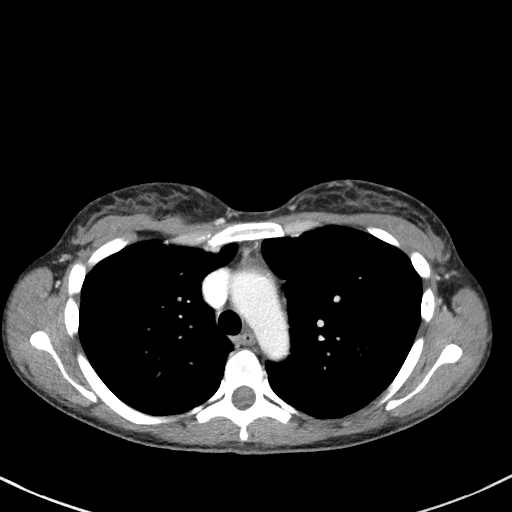
[im 106/127  bone]
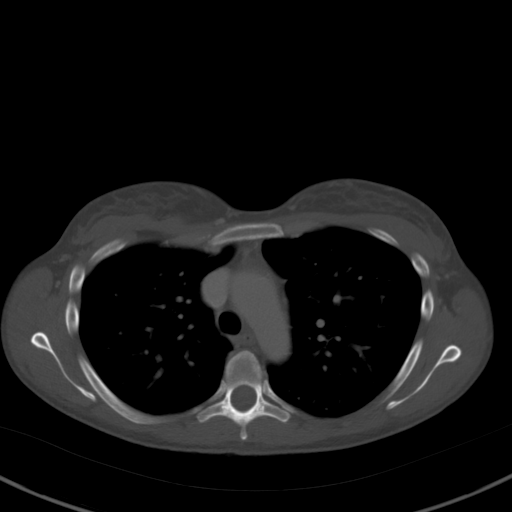
[im 116/127  mediastinal]
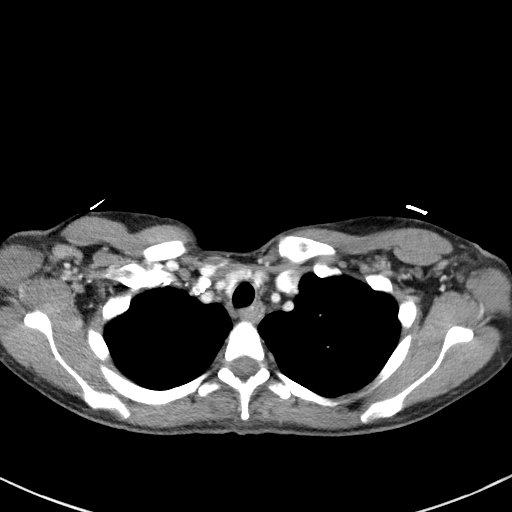

[Series 4: coronals · coronal · 0.86mm/px · 3 of 97 slices shown]
[im 20/97  mediastinal]
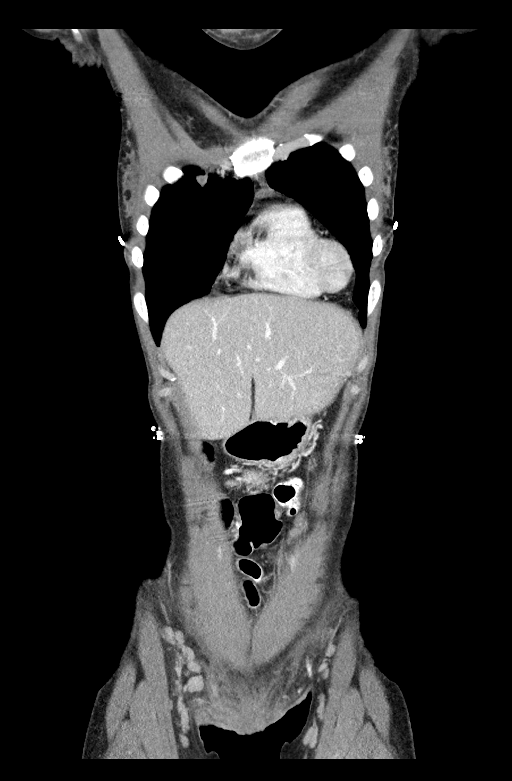
[im 39/97  mediastinal]
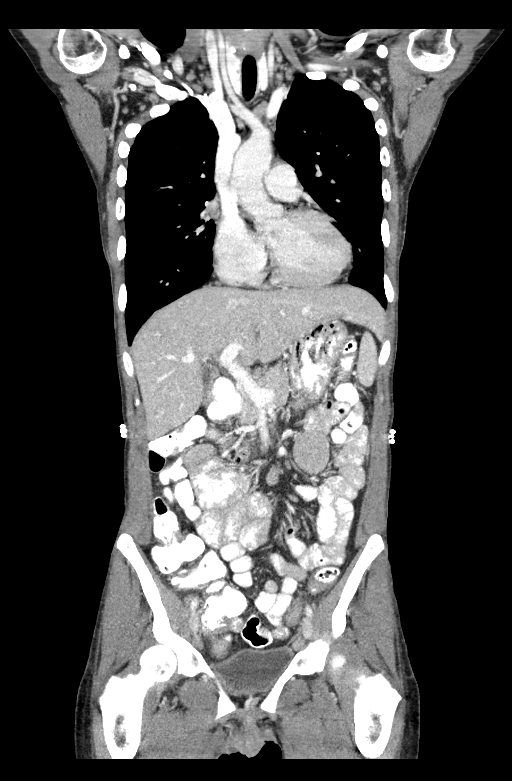
[im 58/97  mediastinal]
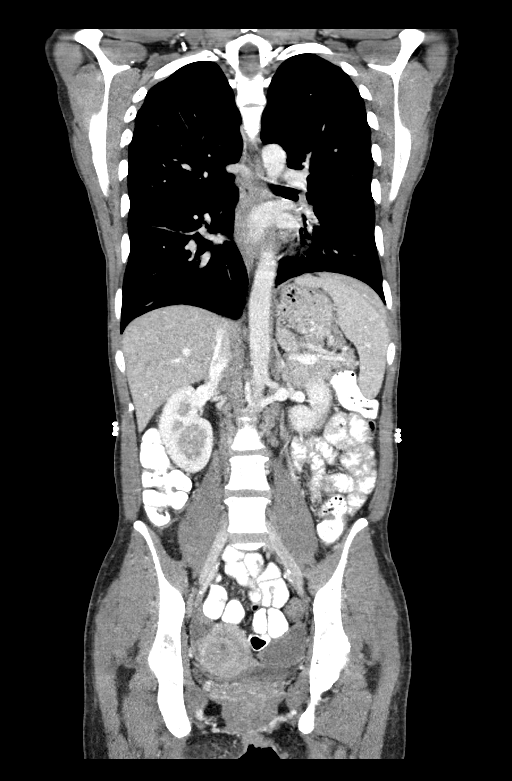

[13 of 36 positions shown; findings below may reference images not displayed]

FINDINGS: CT CHEST FINDINGS

Cardiovascular: No significant vascular findings. Normal heart size.
No pericardial effusion.

Mediastinum/Nodes: Numerous prominent bilateral axillary and
subpectoral lymph nodes, largest right axillary nodes measuring
x 1.0 cm (series 2, image 12). Thyroid gland, trachea, and esophagus
demonstrate no significant findings.

Lungs/Pleura: Diffuse bilateral bronchial wall thickening. Multiple
small pulmonary nodules of the right lung, including a subpleural
nodule of the anterior right upper lobe measuring 0.9 x 0.9 cm
(series 6, image 62), a nodule of the right lower lobe measuring
x 0.7 cm (series 6, image 81), and a nodule of the central right
upper lobe measuring 0.5 cm (series 6, image 49). No pleural
effusion or pneumothorax.

Musculoskeletal: No chest wall mass or suspicious bone lesions
identified.

CT ABDOMEN PELVIS FINDINGS

Hepatobiliary: No solid liver abnormality is seen. No gallstones,
gallbladder wall thickening, or biliary dilatation.

Pancreas: Unremarkable. No pancreatic ductal dilatation or
surrounding inflammatory changes.

Spleen: Normal in size without significant abnormality.

Adrenals/Urinary Tract: Adrenal glands are unremarkable. Kidneys are
normal, without renal calculi, solid lesion, or hydronephrosis.
Bladder is unremarkable.

Stomach/Bowel: Stomach is within normal limits. Appendix appears
normal. No evidence of bowel wall thickening, distention, or
inflammatory changes.

Vascular/Lymphatic: Aortic atherosclerosis. Enlarged bilateral
inguinal and iliac lymph nodes, largest right external iliac node
measuring 1.7 x 1.6 cm (series 2, image 109).

Reproductive: Fibroid of the anterior uterine fundus (series 2,
image 109, series 5, image 53). Fluid attenuation lesion of the left
ovary measuring approximately 4.9 x 3.5 cm (series 2, image 109).
Extensive soft tissue thickening and subcutaneous fat stranding
about the mons, labia, and inguinal creases, with a small
subcutaneous fluid collection of the inferior right inguinal canal
measuring approximately 0.9 cm (series 2, image 119, 122).

Other: No abdominal wall hernia or abnormality. Trace free fluid in
the low pelvis.

Musculoskeletal: No acute or significant osseous findings.
IMPRESSION: 1. Numerous prominent bilateral axillary and subpectoral lymph
nodes, largest right axillary nodes measuring 1.9 x 1.0 cm. Enlarged
bilateral inguinal and iliac lymph nodes, largest right external
iliac node measuring 1.7 x 1.6 cm. These are generally nonspecific,
and particularly inguinal iliac lymph nodes may be reactive to the
cutaneous process described below.
2. Extensive soft tissue thickening and subcutaneous fat stranding
about the mons, labia, and inguinal creases, with a small
subcutaneous fluid collection of the inferior right inguinal canal
measuring approximately 0.9 cm. This appearance suggests
hidradenitis. Correlate with physical examination.
3. Multiple small pulmonary nodules of the right lung, nonspecific,
possibly infectious or inflammatory in nature however pulmonary
lymphomatous involvement is not strictly excluded.
4. Diffuse bilateral bronchial wall thickening, consistent with
nonspecific infectious or inflammatory bronchitis.
5. Fluid attenuation lesion of the left ovary measuring
approximately 4.9 x 3.5 cm, almost certainly benign and functional
in the reproductive age setting No follow-up imaging recommended.
Note: This recommendation does not apply to premenarchal patients
and to those with increased risk (genetic, family history, elevated
tumor markers or other high-risk factors) of ovarian cancer.
Reference: JACR [DATE]):[id number] .
6. Uterine fibroid.
7. Aortic atherosclrosis, significantly advanced for patient age and
gender.

Aortic Atherosclerosis (WTVWX-RAY.Y).

## 2023-05-08 ENCOUNTER — Emergency Department (HOSPITAL_COMMUNITY)
Admission: EM | Admit: 2023-05-08 | Discharge: 2023-05-08 | Disposition: A | Discharge status: Home / Self Care | Payer: Medicaid Other | Attending: Emergency Medicine | Admitting: Emergency Medicine

## 2023-05-08 ENCOUNTER — Other Ambulatory Visit: Payer: Self-pay

## 2023-05-08 DIAGNOSIS — N3001 Acute cystitis with hematuria: Secondary | ICD-10-CM | POA: Insufficient documentation

## 2023-05-08 DIAGNOSIS — R300 Dysuria: Secondary | ICD-10-CM | POA: Diagnosis present

## 2023-05-08 DIAGNOSIS — B20 Human immunodeficiency virus [HIV] disease: Secondary | ICD-10-CM | POA: Insufficient documentation

## 2023-05-08 LAB — URINALYSIS, ROUTINE W REFLEX MICROSCOPIC
Bilirubin Urine: NEGATIVE
Glucose, UA: NEGATIVE mg/dL
Ketones, ur: NEGATIVE mg/dL
Nitrite: NEGATIVE
Protein, ur: 100 mg/dL — AB
Specific Gravity, Urine: 1.012 (ref 1.005–1.030)
WBC, UA: >50 WBC/hpf (ref 0–5)
pH: 6 (ref 5.0–8.0)

## 2023-05-08 LAB — CBC WITH DIFFERENTIAL/PLATELET
Abs Immature Granulocytes: 0.02 10*3/uL (ref 0.00–0.07)
Basophils Absolute: 0 10*3/uL (ref 0.0–0.1)
Basophils Relative: 0 %
Eosinophils Absolute: 0.1 10*3/uL (ref 0.0–0.5)
Eosinophils Relative: 2 %
HCT: 43.5 % (ref 36.0–46.0)
Hemoglobin: 14.9 g/dL (ref 12.0–15.0)
Immature Granulocytes: 0 %
Lymphocytes Relative: 12 %
Lymphs Abs: 1 10*3/uL (ref 0.7–4.0)
MCH: 35.5 pg — ABNORMAL HIGH (ref 26.0–34.0)
MCHC: 34.3 g/dL (ref 30.0–36.0)
MCV: 103.6 fL — ABNORMAL HIGH (ref 80.0–100.0)
Monocytes Absolute: 1 10*3/uL (ref 0.1–1.0)
Monocytes Relative: 13 %
Neutro Abs: 5.8 10*3/uL (ref 1.7–7.7)
Neutrophils Relative %: 73 %
Platelets: 331 10*3/uL (ref 150–400)
RBC: 4.2 MIL/uL (ref 3.87–5.11)
RDW: 12.3 % (ref 11.5–15.5)
WBC: 8 10*3/uL (ref 4.0–10.5)
nRBC: 0 % (ref 0.0–0.2)

## 2023-05-08 LAB — BASIC METABOLIC PANEL
Anion gap: 10 (ref 5–15)
BUN: <5 mg/dL — ABNORMAL LOW (ref 6–20)
CO2: 24 mmol/L (ref 22–32)
Calcium: 8.4 mg/dL — ABNORMAL LOW (ref 8.9–10.3)
Chloride: 98 mmol/L (ref 98–111)
Creatinine, Ser: 0.7 mg/dL (ref 0.44–1.00)
GFR, Estimated: >60 mL/min (ref >60)
Glucose, Bld: 87 mg/dL (ref 70–99)
Potassium: 3.3 mmol/L — ABNORMAL LOW (ref 3.5–5.1)
Sodium: 132 mmol/L — ABNORMAL LOW (ref 135–145)

## 2023-05-08 LAB — HCG, SERUM, QUALITATIVE: Preg, Serum: NEGATIVE

## 2023-05-08 MED ORDER — PHENAZOPYRIDINE HCL 200 MG PO TABS: 200.0000 mg | ORAL_TABLET | Freq: Three times a day (TID) | ORAL | 0 refills | Status: DC

## 2023-05-08 MED ORDER — CEFUROXIME AXETIL 500 MG PO TABS: 500.0000 mg | ORAL_TABLET | Freq: Two times a day (BID) | ORAL | 0 refills | Status: AC

## 2023-05-08 NOTE — Discharge Instructions (Addendum)
Thank you for allowing [place] to be a part of your care today.  Your testing is positive for a urinary tract infection.  I have sent your urine for culture.  He will be contacted if you require different antibiotics than what I have prescribed.  Please begin taking antibiotics as soon as possible.  I have also sent in Pyridium to help with urethral spasms any discomfort you might experience while urinating.  Please increase your water intake over the next several days.  Return to the ED if you develop sudden worsening of your symptoms, develop back pain or flank pain, fever, chills.

## 2023-05-08 NOTE — ED Provider Notes (Signed)
[place] EMERGENCY DEPARTMENT AT [facility] Provider Note   CSN: [id number] Arrival date & time: 05/08/23  1930     History  Chief Complaint  Patient presents with   UTI symptoms    Ashley Barber is a 42 y.o. female with past medical history significant for HIV presents to the ED complaining of dysuria, urinary frequency, and some difficulty urinating for the past 3 days.  Patient states she gets frequent UTIs and she believes that she has another one.  Denies hematuria, flank pain, fever, chills.  She is not currently sexually active.  She is compliant with all of her medications.       Home Medications Prior to Admission medications   Medication Sig Start Date End Date Taking? Authorizing Provider  cefUROXime (CEFTIN) 500 MG tablet Take 1 tablet (500 mg total) by mouth 2 (two) times daily with a meal for 7 days. 05/08/23 05/15/23 Yes Rosmery Duggin R, PA-C  phenazopyridine (PYRIDIUM) 200 MG tablet Take 1 tablet (200 mg total) by mouth 3 (three) times daily. 05/08/23  Yes Lakyia Behe R, PA-C  dolutegravir-lamiVUDine (DOVATO) 50-300 MG tablet Take 1 tablet by mouth daily. 10/25/22   Veryl Speak, FNP  hydrOXYzine (VISTARIL) 50 MG capsule Take 1 capsule (50 mg total) by mouth 3 (three) times daily as needed. 01/24/22   Waldon Merl, PA-C  levocetirizine (XYZAL) 5 MG tablet Take 1 tablet (5 mg total) by mouth every evening. 01/24/22   Waldon Merl, PA-C      Allergies    Patient has no known allergies.    Review of Systems   Review of Systems  Constitutional:  Negative for chills and fever.  Genitourinary:  Positive for difficulty urinating, dysuria, frequency and urgency. Negative for flank pain and hematuria.    Physical Exam Updated Vital Signs BP (!) 132/92   Pulse (!) 109   Temp 98 F (36.7 C)   Resp 18   SpO2 98%  Physical Exam Vitals and nursing note reviewed.  Constitutional:      General: She is not in acute distress.    Appearance:  Normal appearance. She is not ill-appearing or diaphoretic.  Cardiovascular:     Rate and Rhythm: Normal rate and regular rhythm.  Pulmonary:     Effort: Pulmonary effort is normal.  Abdominal:     General: Abdomen is flat.     Palpations: Abdomen is soft.     Tenderness: There is abdominal tenderness in the suprapubic area.  Skin:    General: Skin is warm and dry.     Capillary Refill: Capillary refill takes less than 2 seconds.  Neurological:     Mental Status: She is alert. Mental status is at baseline.  Psychiatric:        Mood and Affect: Mood normal.        Behavior: Behavior normal.     ED Results / Procedures / Treatments   Labs (all labs ordered are listed, but only abnormal results are displayed) Labs Reviewed  URINALYSIS, ROUTINE W REFLEX MICROSCOPIC - Abnormal; Notable for the following components:      Result Value   APPearance CLOUDY (*)    Hgb urine dipstick MODERATE (*)    Protein, ur 100 (*)    Leukocytes,Ua LARGE (*)    Bacteria, UA RARE (*)    Non Squamous Epithelial 0-5 (*)    All other components within normal limits  BASIC METABOLIC PANEL - Abnormal; Notable for the  following components:   Sodium 132 (*)    Potassium 3.3 (*)    BUN <5 (*)    Calcium 8.4 (*)    All other components within normal limits  CBC WITH DIFFERENTIAL/PLATELET - Abnormal; Notable for the following components:   MCV 103.6 (*)    MCH 35.5 (*)    All other components within normal limits  URINE CULTURE  HCG, SERUM, QUALITATIVE  CBC WITH DIFFERENTIAL/PLATELET    EKG None  Radiology No results found.  Procedures Procedures    Medications Ordered in ED Medications - No data to display  ED Course/ Medical Decision Making/ A&P                                 Medical Decision Making Amount and/or Complexity of Data Reviewed Labs: ordered.   This patient presents to the ED with chief complaint(s) of UTI symptoms with pertinent past medical history of HIV,  compliant with medication.  The complaint involves an extensive differential diagnosis and also carries with it a high risk of complications and morbidity.    The differential diagnosis includes UTI, pyelonephritis   The initial plan is to obtain labs, UA  Additional history obtained: Records reviewed  infectious disease  Initial Assessment:   On exam, patient is overall well-appearing and is not in acute distress.  Abdomen soft and nondistended.  She does have tenderness in the suprapubic region.  Skin is warm and dry.    Independent ECG/labs interpretation:  The following labs were independently interpreted:  UA with evidence of infection.  Will send for culture.  Metabolic panel with mild hypokalemia and hyponatremia.  Renal function is normal.  Pregnancy negative.  CBC without leukocytosis or anemia.  Disposition:   Will send Ceftin and Pyridium to the pharmacy to treat acute UTI.  Patient's urine sent for culture.  The patient has been appropriately medically screened and/or stabilized in the ED. I have low suspicion for any other emergent medical condition which would require further screening, evaluation or treatment in the ED or require inpatient management. At time of discharge the patient is hemodynamically stable and in no acute distress. I have discussed work-up results and diagnosis with patient and answered all questions. Patient is agreeable with discharge plan. We discussed strict return precautions for returning to the emergency department and they verbalized understanding.              Final Clinical Impression(s) / ED Diagnoses Final diagnoses:  Acute cystitis with hematuria  HIV disease (HCC)    Rx / DC Orders ED Discharge Orders          Ordered    phenazopyridine (PYRIDIUM) 200 MG tablet  3 times daily        05/08/23 2206    cefUROXime (CEFTIN) 500 MG tablet  2 times daily with meals        05/08/23 2206              Lenard Simmer,  PA-C 05/08/23 2211    Rondel Baton, MD 05/09/23 1143

## 2023-05-08 NOTE — ED Triage Notes (Signed)
Patient reports urinary frequency with dysuria this week , denies fever or chills . No hematuria .

## 2023-05-10 LAB — URINE CULTURE: Culture: >=100000 — AB

## 2023-05-11 ENCOUNTER — Telehealth (HOSPITAL_BASED_OUTPATIENT_CLINIC_OR_DEPARTMENT_OTHER): Payer: Self-pay | Admitting: *Deleted

## 2023-05-11 NOTE — Telephone Encounter (Signed)
Post ED Visit - Positive Culture Follow-up  Culture report reviewed by antimicrobial stewardship pharmacist: [facility] []  Enzo Bi, Pharm.D. []  Celedonio Miyamoto, Pharm.D., BCPS AQ-ID []  Garvin Fila, Pharm.D., BCPS []  Georgina Pillion, Pharm.D., BCPS []  [place], [place].D., BCPS, AAHIVP []  Estella Husk, Pharm.D., BCPS, AAHIVP []  Lysle Pearl, PharmD, BCPS []  Phillips Climes, PharmD, BCPS []  Agapito Games, PharmD, BCPS []  Verlan Friends, PharmD []  Mervyn Gay, PharmD, BCPS [x]  Gillis Ends, PharmD  [facility] []  Len Childs, PharmD []  Greer Pickerel, PharmD []  Adalberto Cole, PharmD []  Perlie Gold, Rph []  Lonell Face) Jean Rosenthal, PharmD []  Earl Many, PharmD []  Junita Push, PharmD []  Dorna Leitz, PharmD []  Terrilee Files, PharmD []  Lynann Beaver, PharmD []  Keturah Barre, PharmD []  Loralee Pacas, PharmD []  Bernadene Person, PharmD   Positive urine culture Treated with Cefuroxime Axetil, organism sensitive to the same and no further patient follow-up is required at this time.  Patsey Berthold 05/11/2023, 8:05 AM

## 2023-05-17 ENCOUNTER — Ambulatory Visit (INDEPENDENT_AMBULATORY_CARE_PROVIDER_SITE_OTHER): Payer: Medicaid Other

## 2023-05-17 DIAGNOSIS — Z3042 Encounter for surveillance of injectable contraceptive: Secondary | ICD-10-CM | POA: Diagnosis not present

## 2023-05-17 MED ORDER — MEDROXYPROGESTERONE ACETATE 150 MG/ML IM SUSY
150.0000 mg | PREFILLED_SYRINGE | Freq: Once | INTRAMUSCULAR | Status: AC
  Administered 2023-05-17: 150 mg via INTRAMUSCULAR

## 2023-05-17 NOTE — Progress Notes (Signed)
Patient here today for Depo Provera injection and is within her dates.     Last contraceptive appt was 11/29/22.   Depo given in RUOQ today.  Site unremarkable & patient tolerated injection.     Next injection due 08/02/2023-08/16/2023.Marland Kitchen    Reminder card given.

## 2023-07-05 ENCOUNTER — Other Ambulatory Visit: Payer: Self-pay

## 2023-07-05 ENCOUNTER — Encounter: Payer: Self-pay | Admitting: Family

## 2023-07-05 ENCOUNTER — Ambulatory Visit: Admitting: Family

## 2023-07-05 ENCOUNTER — Ambulatory Visit (INDEPENDENT_AMBULATORY_CARE_PROVIDER_SITE_OTHER): Admitting: Family

## 2023-07-05 VITALS — BP 129/86 | HR 81 | Temp 98.0°F

## 2023-07-05 DIAGNOSIS — L2989 Other pruritus: Secondary | ICD-10-CM

## 2023-07-05 DIAGNOSIS — Z0000 Encounter for general adult medical examination without abnormal findings: Secondary | ICD-10-CM

## 2023-07-05 DIAGNOSIS — B20 Human immunodeficiency virus [HIV] disease: Secondary | ICD-10-CM | POA: Diagnosis not present

## 2023-07-05 MED ORDER — HYDROXYZINE PAMOATE 50 MG PO CAPS: 50.0000 mg | ORAL_CAPSULE | Freq: Three times a day (TID) | ORAL | 0 refills | Status: DC | PRN

## 2023-07-05 MED ORDER — DOVATO 50-300 MG PO TABS: 1.0000 | ORAL_TABLET | Freq: Every day | ORAL | 5 refills | Status: DC

## 2023-07-05 NOTE — Patient Instructions (Signed)
Nice to see you.  We will check your lab work today.  Continue to take your medication daily as prescribed.  Refills have been sent to the pharmacy.  Plan for follow up in 3 months or sooner if needed with lab work on the same day.  Have a great day and stay safe!  

## 2023-07-05 NOTE — Assessment & Plan Note (Signed)
Ashley Barber continues to have well controlled virus with good adherence and tolerance to Dovato. Did miss some medication since she has been incarcerated but now back on track. Reviewed previous lab work and discussed plan of care. Check lab work. Interested in [place] and will discuss at next office visit in conjunction with pharmacy team. Plan for follow up in 3 months or sooner if needed with lab work on the same day.

## 2023-07-05 NOTE — Progress Notes (Signed)
Brief Narrative   Patient ID: Ashley Barber, female    DOB: 08/08/1981, 42 y.o.   MRN: [id number]  Ashley Barber is a 42 y/o AA female diagnosed with HIV disease on 08/01/21 with risk factor of heterosexual contact. Initial viral load of 1.21 million with CD4 count of 235. Genotype with no significant medication resistance mutations. [id number] negative. No history of opportunistic infection. Enters care in [facility] Stage 2. Sole ART regimen of Biktarvy to start and transitioned to Dovato.    Subjective:    Chief Complaint  Patient presents with   Follow-up    B20    HPI:  Ashley Barber is a 42 y.o. female with HIV disease last seen on 10/25/22 with well controlled virus and good adherence and tolerance to Dovato. Viral load was undetectable and CD4 count was 227. Renal function and electrolytes within normal ranges. Here today for follow up.   [name] is currently incarcerated and [facility] Deputies are accompanying her for today's visit. Has been off medication for about 1 month since being incarcerated and was restarted on Dovato about 2 weeks ago and has been taking it regularly since. Being released today and would like to discuss starting on Cabenuva in the future. Feeling well with no new concerns/complaints. Reviewed vaccinations. Due for cervical cancer screening.   Denies fevers, chills, night sweats, headaches, changes in vision, neck pain/stiffness, nausea, diarrhea, vomiting, lesions or rashes.  Lab Results  Component Value Date   CD4TCELL 17 (L) 08/15/2022   CD4TABS 227 (L) 08/15/2022   Lab Results  Component Value Date   HIV1RNAQUANT Not Detected 08/15/2022     No Known Allergies    Outpatient Medications Prior to Visit  Medication Sig Dispense Refill   albuterol (VENTOLIN HFA) 108 (90 Base) MCG/ACT inhaler Inhale into the lungs every 6 (six) hours as needed for wheezing or shortness of breath.     dolutegravir-lamiVUDine (DOVATO) 50-300 MG tablet Take 1  tablet by mouth daily. 30 tablet 5   levocetirizine (XYZAL) 5 MG tablet Take 1 tablet (5 mg total) by mouth every evening. (Patient not taking: Reported on 07/05/2023) 30 tablet 0   hydrOXYzine (VISTARIL) 50 MG capsule Take 1 capsule (50 mg total) by mouth 3 (three) times daily as needed. (Patient not taking: Reported on 07/05/2023) 30 capsule 0   phenazopyridine (PYRIDIUM) 200 MG tablet Take 1 tablet (200 mg total) by mouth 3 (three) times daily. (Patient not taking: Reported on 07/05/2023) 6 tablet 0   No facility-administered medications prior to visit.     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  Eyes:        Negative for acute change in vision  Respiratory:  Negative for chest tightness, shortness of breath and wheezing.   Cardiovascular:  Negative for chest pain.  Gastrointestinal:  Negative for diarrhea, nausea and vomiting.  Genitourinary:  Negative for dysuria, pelvic pain and vaginal discharge.  Musculoskeletal:  Negative for neck pain and neck stiffness.  Skin:  Negative for rash.  Neurological:  Negative for seizures, syncope, weakness and headaches.  Hematological:  Negative for adenopathy. Does not bruise/bleed easily.  Psychiatric/Behavioral:  Negative for  hallucinations.       Objective:    BP 129/86   Pulse 81   Temp 98 F (36.7 C) (Temporal)   SpO2 96%  Nursing note and vital signs reviewed.  Physical Exam Constitutional:      General: She is not in acute distress.    Appearance: She is well-developed.  Eyes:     Conjunctiva/sclera: Conjunctivae normal.  Cardiovascular:     Rate and Rhythm: Normal rate and regular rhythm.     Heart sounds: Normal heart sounds. No  murmur heard.    No friction rub. No gallop.  Pulmonary:     Effort: Pulmonary effort is normal. No respiratory distress.     Breath sounds: Normal breath sounds. No wheezing or rales.  Chest:     Chest wall: No tenderness.  Abdominal:     General: Bowel sounds are normal.     Palpations: Abdomen is soft.     Tenderness: There is no abdominal tenderness.  Musculoskeletal:     Cervical back: Neck supple.  Lymphadenopathy:     Cervical: No cervical adenopathy.  Skin:    General: Skin is warm and dry.     Findings: No rash.  Neurological:     Mental Status: She is alert and oriented to person, place, and time.  Psychiatric:        Behavior: Behavior normal.        Thought Content: Thought content normal.        Judgment: Judgment normal.         11/29/2022   11:04 AM 07/03/2022   11:08 AM 04/06/2022   11:35 AM 04/02/2022    2:02 PM 10/25/2021    4:01 PM  Depression screen PHQ 2/9  Decreased Interest 0 1 0 0 0  Down, Depressed, Hopeless 0 2 0 0 0  PHQ - 2 Score 0 3 0 0 0  Altered sleeping 0 3 0 0   Tired, decreased energy 0 0 0 0   Change in appetite 0 0 0 0   Feeling bad or failure about yourself  0 3 0 0   Trouble concentrating 0 0 0 0   Moving slowly or fidgety/restless 0 0 0 0   Suicidal thoughts 0 0 0 0   PHQ-9 Score 0 9 0 0   Difficult doing work/chores Not difficult at all Somewhat difficult          Assessment & Plan:    Patient Active Problem List   Diagnosis Date Noted   Healthcare maintenance 09/07/2021   HIV disease (HCC) 08/01/2021   Abscess 07/13/2021   Encounter for management and injection of depo-Provera 02/25/2021   Biological false positive RPR test 01/09/2021   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 continues to have well controlled virus with good adherence and tolerance to Dovato. Did miss some medication since she has been incarcerated but  now back on track. Reviewed previous lab work and discussed plan of care. Check lab work. Interested in [place] and will discuss at next office visit in conjunction with pharmacy team. Plan for follow up in 3 months or sooner if needed with lab work on the same day.       Relevant Medications   dolutegravir-lamiVUDine (DOVATO) 50-300 MG tablet   Other Relevant Orders   COMPLETE METABOLIC PANEL WITH  GFR   HIV-1 RNA quant-no reflex-bld   T-helper cells (CD4) count (not at [facility])   Healthcare maintenance    Discussed importance of safe sexual practice and condom use. Condoms and STD testing offered.  Reviewed vaccinations - declined today Due for cervical cancer screening which she will schedule with Rexene Alberts, NP or through gynecology. Breast cancer screening pending.       Other Visit Diagnoses     Chronic pruritic rash in adult       Relevant Medications   hydrOXYzine (VISTARIL) 50 MG capsule        I have discontinued Florence Canner. Roach's phenazopyridine. I am also having her maintain her levocetirizine, albuterol, Dovato, and hydrOXYzine.   Meds ordered this encounter  Medications   dolutegravir-lamiVUDine (DOVATO) 50-300 MG tablet    Sig: Take 1 tablet by mouth daily.    Dispense:  30 tablet    Refill:  5    Order Specific Question:   Supervising Provider    Answer:   Judyann Munson [phone]    Order Specific Question:   Prescription Type:    Answer:   Renewal   hydrOXYzine (VISTARIL) 50 MG capsule    Sig: Take 1 capsule (50 mg total) by mouth 3 (three) times daily as needed.    Dispense:  30 capsule    Refill:  0    Order Specific Question:   Supervising Provider    Answer:   Judyann Munson [[id number]]     Follow-up: Return in about 3 months (around 10/05/2023), or if symptoms worsen or fail to improve. or sooner if needed.    Marcos Eke, MSN, FNP-C Nurse Practitioner [facility] for [facility]ious Disease [facility] Main number:  [phone]

## 2023-07-05 NOTE — Assessment & Plan Note (Signed)
Discussed importance of safe sexual practice and condom use. Condoms and STD testing offered.  Reviewed vaccinations - declined today Due for cervical cancer screening which she will schedule with Rexene Alberts, NP or through gynecology. Breast cancer screening pending.

## 2023-07-08 LAB — COMPLETE METABOLIC PANEL WITH GFR
AG Ratio: 0.9 (calc) — ABNORMAL LOW (ref 1.0–2.5)
ALT: 7 U/L (ref 6–29)
AST: 16 U/L (ref 10–30)
Albumin: 3.6 g/dL (ref 3.6–5.1)
Alkaline phosphatase (APISO): 47 U/L (ref 31–125)
BUN/Creatinine Ratio: 7 (calc) (ref 6–22)
BUN: 5 mg/dL — ABNORMAL LOW (ref 7–25)
CO2: 27 mmol/L (ref 20–32)
Calcium: 8.9 mg/dL (ref 8.6–10.2)
Chloride: 107 mmol/L (ref 98–110)
Creat: 0.68 mg/dL (ref 0.50–0.99)
Globulin: 4 g/dL — ABNORMAL HIGH (ref 1.9–3.7)
Glucose, Bld: 80 mg/dL (ref 65–99)
Potassium: 4 mmol/L (ref 3.5–5.3)
Sodium: 141 mmol/L (ref 135–146)
Total Bilirubin: 0.4 mg/dL (ref 0.2–1.2)
Total Protein: 7.6 g/dL (ref 6.1–8.1)
eGFR: 112 mL/min/{1.73_m2} (ref >=60)

## 2023-07-08 LAB — T-HELPER CELLS (CD4) COUNT (NOT AT ARMC)
Absolute CD4: 461 {cells}/uL — ABNORMAL LOW (ref 490–1740)
CD4 T Helper %: 11 % — ABNORMAL LOW (ref 30–61)
Total lymphocyte count: 4050 {cells}/uL — ABNORMAL HIGH (ref 850–3900)

## 2023-07-08 LAB — HIV-1 RNA QUANT-NO REFLEX-BLD
HIV 1 RNA Quant: 625 {copies}/mL — ABNORMAL HIGH
HIV-1 RNA Quant, Log: 2.8 {Log_copies}/mL — ABNORMAL HIGH

## 2023-07-31 ENCOUNTER — Ambulatory Visit: Payer: Medicaid Other | Admitting: Internal Medicine

## 2024-01-14 ENCOUNTER — Encounter: Payer: Self-pay | Admitting: Student

## 2024-01-14 ENCOUNTER — Ambulatory Visit (INDEPENDENT_AMBULATORY_CARE_PROVIDER_SITE_OTHER): Admitting: Student

## 2024-01-14 VITALS — BP 133/85 | HR 90 | Ht 62.5 in | Wt 124.8 lb

## 2024-01-14 DIAGNOSIS — Z3202 Encounter for pregnancy test, result negative: Secondary | ICD-10-CM | POA: Diagnosis not present

## 2024-01-14 DIAGNOSIS — R4589 Other symptoms and signs involving emotional state: Secondary | ICD-10-CM | POA: Diagnosis not present

## 2024-01-14 DIAGNOSIS — Z3042 Encounter for surveillance of injectable contraceptive: Secondary | ICD-10-CM

## 2024-01-14 LAB — POCT URINE PREGNANCY: Preg Test, Ur: NEGATIVE

## 2024-01-14 MED ORDER — MEDROXYPROGESTERONE ACETATE 150 MG/ML IM SUSP
150.0000 mg | Freq: Once | INTRAMUSCULAR | Status: AC
Start: 2024-01-14 — End: 2024-01-14
  Administered 2024-01-14: 150 mg via INTRAMUSCULAR

## 2024-01-14 NOTE — Patient Instructions (Signed)
 It was great to see you today! Thank you for choosing [facility] for your primary care.  Today we addressed: I have sent the referral to psychiatry Return in 1 month    Therapy and Counseling Resources Most providers on this list will take Medicaid. Patients with commercial insurance or Medicare should contact their insurance company to get a list of in network providers.  [facility] (spanish speaking therapist available)(habla espanol)(take medicare and medicaid)  [address], [place], [place] [address], [place]  [email or web address] [phone]  [facility]Psychiatry and Counseling [address] [place], [place] [address] [phone]  [facility]   [address], Suite [place], [place] [address]      [phone]  [facility] (spanish available) [address]  [place], [place] [address] [phone]  [facility] (take [facility] and medicare) [address]  [place], [place] [address]       [phone]     [facility] (virtual only) [phone]  [facility] [address], [place], [place] [phone]  [facility]:  [address] [place] [phone]  [facility]:  [address] [place] [phone]  [facility] (under & uninsured) [address]   [place] [phone]    [email or web address]    [facility] [address]  [place]    [phone]  [facility] -[address]     Phone:  [phone]     [facility]-  [address]  [phone]   [facility] [address]      [place], [place] [phone]  [facility]: [address] [place], [place], [place]  [phone]   [facility] (Spanish therapist) [email or web address]/  [address] [place]   [address]   [place] [address]    [phone]    [facility]   [address],  [place], [place]  [phone]   [facility]  [address] [place]  [phone]  [facility]  [address] [place], [place]        [phone]  Open Access/Walk In Clinic under & uninsured  [facility]  [address] [place], [place] [phone] Crisis [phone]  [facility] [address],  (Spanish)   [address] , [place]: [phone]) 8:30 - 12; 1 - 2:30  [facility] [employer],  [address] [place], [place]    [phone]):8:30 - 12; 2 - 3PM  [facility] [employer],  [address]  [place]; [phone]):   Mon - Fri 8 AM - 5 PM  [facility] [address] [place]  MWF 12:30 to 3:00 or call to schedule an appointment  [phone]  Specific Provider options Psychology Today  https://www.psychologytoday.com/us  click on find a therapist  enter your zip code left side and select or tailor a therapist for your specific need.   [facility] Provider Directory [email or web address]  (Medicaid)   Follow all drop down to find a provider  Social Support program [facility] [place] [phone] or [employer] [address], [place], [place] Recovery support and educational   24- Hour Availability:   [facility]  [address] [place], [place] [phone] Crisis [phone]  [facility] of the [facility] [phone]  [facility] Crisis Service  [phone]   [facility]  [phone] (after hours)  Therapeutic Alternative/Mobile Crisis   [phone]  [place]  National Suicide Hotline  530 525 7042 Derrel Flies)  Call 911 or go to emergency room  [facility]  [phone]);  [place] and [employer]  [phone]); [place], [place], [place], [place], [place], [place], [place]   If you haven't already, sign up for My Chart to have easy access to your labs results, and communication with your primary care physician.   Please arrive 15 minutes before your appointment to ensure smooth check in process.  We appreciate your efforts in making this happen.  Thank you for allowing me to participate in your care, Ernestina Headland, MD 01/14/2024, 11:17 AM PGY-3, [facility]

## 2024-01-14 NOTE — Assessment & Plan Note (Signed)
 Depo given, u preg negative

## 2024-01-14 NOTE — Progress Notes (Signed)
 The 10-year ASCVD risk score (Arnett DK, et al., 2019) is: 1.4%   Values used to calculate the score:     Age: 43 years     Sex: Female     Is Non-Hispanic African American: Yes     Diabetic: No     Tobacco smoker: Yes     Systolic Blood Pressure: 129 mmHg     Is BP treated: No     HDL Cholesterol: 59 mg/dL     Total Cholesterol: 219 mg/dL  Arlon Bergamo, BSN, RN

## 2024-01-14 NOTE — Progress Notes (Signed)
    SUBJECTIVE:   CHIEF COMPLAINT / HPI:   Discuss BC:  - LMP: irregular periods noted  - Tried: Depo in past  - wants depo again  - no new sexual activity  Stress  Depressed mood  presents with stress and emotional concerns related to recent life changes.  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 She feels 'not there right now' and acknowledges a stressful period since November. Resuming contact with her mother after a couple of months has also contributed to her emotional state. She denies depression but acknowledges stress. She has a history of depression, previously treated with Xanax, but has not used this medication for two to three years and has not seen a psychiatrist recently. She denies chest pain or shortness of breath. She has a good support system and home life, with access to food and necessities.  PERTINENT  PMH / PSH: HIV Recently incarcerated  Mycosis Fungoides  OBJECTIVE:   BP 133/85   Pulse 90   Ht 5' 2.5" (1.588 m)   Wt 124 lb 12.8 oz (56.6 kg)   SpO2 99%   BMI 22.46 kg/m   General: Alert and oriented in no apparent distress Heart: Regular rate and rhythm with no murmurs appreciated Lungs: no wheezing Abdomen: no abdominal pain Skin: Warm and dry Psych: Intermittently tearful, norma affect and mood    ASSESSMENT/PLAN:   Assessment & Plan Depressed mood Psychiatry referral on patient request No SI or HI  Significant life changes include a new job and renewed contact with her mother. History of depression treated with Xanax two to three years ago. Supportive home environment and access to food. Therapy recommended for stress and life changes. Open to therapy and psychiatric consultation. - Provide therapy options for stress management and life changes. - Refer to psychiatry for evaluation and discussion of  medication options. Encounter for management and injection of depo-Provera  Depo given, u preg negative     Ernestina Headland, MD [facility]

## 2024-02-11 ENCOUNTER — Encounter: Payer: Self-pay | Admitting: *Deleted

## 2024-03-24 ENCOUNTER — Other Ambulatory Visit: Payer: Self-pay | Admitting: Family

## 2024-03-24 ENCOUNTER — Other Ambulatory Visit: Payer: Self-pay

## 2024-03-31 ENCOUNTER — Other Ambulatory Visit: Payer: Self-pay

## 2024-03-31 ENCOUNTER — Other Ambulatory Visit

## 2024-03-31 DIAGNOSIS — Z113 Encounter for screening for infections with a predominantly sexual mode of transmission: Secondary | ICD-10-CM

## 2024-03-31 DIAGNOSIS — B20 Human immunodeficiency virus [HIV] disease: Secondary | ICD-10-CM

## 2024-03-31 DIAGNOSIS — Z79899 Other long term (current) drug therapy: Secondary | ICD-10-CM

## 2024-04-14 ENCOUNTER — Ambulatory Visit: Admitting: Family

## 2024-05-05 ENCOUNTER — Other Ambulatory Visit: Payer: Self-pay

## 2024-05-05 ENCOUNTER — Encounter: Payer: Self-pay | Admitting: Family

## 2024-05-05 ENCOUNTER — Ambulatory Visit: Admitting: Family

## 2024-05-05 VITALS — BP 132/85 | HR 106 | Temp 98.6°F | Wt 130.0 lb

## 2024-05-05 DIAGNOSIS — Z79899 Other long term (current) drug therapy: Secondary | ICD-10-CM | POA: Diagnosis not present

## 2024-05-05 DIAGNOSIS — B20 Human immunodeficiency virus [HIV] disease: Secondary | ICD-10-CM | POA: Diagnosis not present

## 2024-05-05 DIAGNOSIS — Z0000 Encounter for general adult medical examination without abnormal findings: Secondary | ICD-10-CM

## 2024-05-05 DIAGNOSIS — Z720 Tobacco use: Secondary | ICD-10-CM

## 2024-05-05 DIAGNOSIS — Z1231 Encounter for screening mammogram for malignant neoplasm of breast: Secondary | ICD-10-CM

## 2024-05-05 MED ORDER — DOVATO 50-300 MG PO TABS: 1.0000 | ORAL_TABLET | Freq: Every day | ORAL | 5 refills | Status: AC

## 2024-05-05 MED ORDER — BUPROPION HCL ER (SR) 150 MG PO TB12: ORAL_TABLET | ORAL | 1 refills | Status: DC

## 2024-05-05 NOTE — Progress Notes (Signed)
 Brief Narrative   Patient ID: Ashley Barber, female    DOB: 09/22/1980, 43 y.o.   MRN: [id number]  Ashley Barber is a 44 y/o AA female diagnosed with HIV disease on 08/01/21 with risk factor of heterosexual contact. Initial viral load of 1.21 million with CD4 count of 235. Genotype with no significant medication resistance mutations. [id number] negative. No history of opportunistic infection. Enters care in [facility] Stage 2. Sole ART regimen of Biktarvy  to start and transitioned to Dovato .    Subjective:   Chief Complaint  Patient presents with   Follow-up    B20, Tobacco use    HPI:  Ashley Barber is a 43 y.o. female with HIV disease last seen on 07/05/2023 with less than optimal adherence and good tolerance to Dovato . Vial load was detectable at 625 and CD4 count 461.  Was incarcerated  during her last visit. Kidney function, liver function, electrolytes within normal ranges.  Here today for follow-up.  Ashley Barber has been doing well since her last office visit and continues to take Dovato  as prescribed with no adverse side effects or problems obtaining medication from the pharmacy.  Covered by Medicaid.  Housing, transportation, and access to food are stable.  She is currently engaged and planning on getting married near her birthday next year.  Currently smoking approximately 1/2 pack of cigarettes per day and is interested in quitting smoking.  Healthcare maintenance reviewed.  Due for routine dental care.  Denies fevers, chills, night sweats, headaches, changes in vision, neck pain/stiffness, nausea, diarrhea, vomiting, lesions or rashes.  Lab Results  Component Value Date   CD4TCELL 11 (L) 07/05/2023   CD4TABS 227 (L) 08/15/2022   Lab Results  Component Value Date   HIV1RNAQUANT 625 (H) 07/05/2023     No Known Allergies    Outpatient Medications Prior to Visit  Medication Sig Dispense Refill   albuterol (VENTOLIN HFA) 108 (90 Base) MCG/ACT inhaler Inhale into the lungs  every 6 (six) hours as needed for wheezing or shortness of breath.     hydrOXYzine  (VISTARIL ) 50 MG capsule Take 1 capsule (50 mg total) by mouth 3 (three) times daily as needed. 30 capsule 0   DOVATO  50-300 MG tablet TAKE 1 TABLET BY MOUTH DAILY 30 tablet 5   levocetirizine (XYZAL ) 5 MG tablet Take 1 tablet (5 mg total) by mouth every evening. (Patient not taking: Reported on 05/05/2024) 30 tablet 0   No facility-administered medications prior to visit.     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  Eyes:        Negative for acute change in vision  Respiratory:  Negative for chest tightness, shortness of breath and wheezing.   Cardiovascular:  Negative for chest pain.  Gastrointestinal:  Negative for diarrhea, nausea and vomiting.  Genitourinary:  Negative for dysuria, pelvic pain and vaginal discharge.  Musculoskeletal:  Negative for neck pain and neck stiffness.  Skin:  Negative for rash.  Neurological:  Negative for seizures, syncope, weakness and headaches.  Hematological:  Negative for adenopathy. Does not bruise/bleed easily.  Psychiatric/Behavioral:  Negative for hallucinations.      Objective:   BP  132/85   Pulse (!) 106   Temp 98.6 F (37 C) (Oral)   Wt 130 lb (59 kg)   LMP 11/26/2022   SpO2 99%   BMI 23.40 kg/m  Nursing note and vital signs reviewed.  Physical Exam Constitutional:      General: She is not in acute distress.    Appearance: She is well-developed.  Eyes:     Conjunctiva/sclera: Conjunctivae normal.  Cardiovascular:     Rate and Rhythm: Normal rate and regular rhythm.     Heart sounds: Normal heart sounds. No murmur heard.    No friction rub. No gallop.   Pulmonary:     Effort: Pulmonary effort is normal. No respiratory distress.     Breath sounds: Normal breath sounds. No wheezing or rales.  Chest:     Chest wall: No tenderness.  Abdominal:     General: Bowel sounds are normal.     Palpations: Abdomen is soft.     Tenderness: There is no abdominal tenderness.  Musculoskeletal:     Cervical back: Neck supple.  Lymphadenopathy:     Cervical: No cervical adenopathy.  Skin:    General: Skin is warm and dry.     Findings: No rash.  Neurological:     Mental Status: She is alert and oriented to person, place, and time.          05/05/2024    1:36 PM 01/14/2024   10:59 AM 11/29/2022   11:04 AM 07/03/2022   11:08 AM 04/06/2022   11:35 AM  Depression screen PHQ 2/9  Decreased Interest 0 0 0 1 0  Down, Depressed, Hopeless 0 0 0 2 0  PHQ - 2 Score 0 0 0 3 0  Altered sleeping 0 0 0 3 0  Tired, decreased energy 0 0 0 0 0  Change in appetite 0 0 0 0 0  Feeling bad or failure about yourself  0 0 0 3 0  Trouble concentrating 0 0 0 0 0  Moving slowly or fidgety/restless 0 0 0 0 0  Suicidal thoughts 0 0 0 0 0  PHQ-9 Score 0 0 0 9 0  Difficult doing work/chores Not difficult at all  Not difficult at all Somewhat difficult         05/05/2024    1:36 PM  GAD 7 : Generalized Anxiety Score  Nervous, Anxious, on Edge 0  Control/stop worrying 0  Worry too much - different things 0  Trouble relaxing 0  Restless 0  Easily annoyed or irritable 0  Afraid - awful might happen 0  Total GAD 7 Score 0  Anxiety Difficulty Not difficult at all     The 10-year ASCVD risk score (Arnett DK, et al., 2019) is: 1.6%   Values used to calculate the score:     Age: 66 years     Clincally relevant sex: Female     Is Non-Hispanic African American: Yes     Diabetic: No     Tobacco smoker: Yes     Systolic Blood Pressure: 132 mmHg     Is BP treated: No     HDL Cholesterol: 59 mg/dL     Total Cholesterol: 219 mg/dL      Assessment & Plan:     Patient Active Problem List   Diagnosis Date Noted   Tobacco use 05/05/2024   Healthcare maintenance 09/07/2021   HIV disease (HCC) 08/01/2021   Abscess 07/13/2021   Encounter for management and injection of  depo-Provera  02/25/2021   Biological false positive RPR test 01/09/2021   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  Reviewed previous lab work and discussed plan of care and U equals U.  No problems obtaining medication from the pharmacy and covered by Medicaid.  Social determinants of health reviewed with no interventions indicated.  Check blood work.  Continue current dose of Dovato .  Interested in [place] and will see if we can logistically make this work Plan for follow-up in 2 months or sooner if needed.      Relevant Medications   dolutegravir-lamiVUDine (DOVATO ) 50-300 MG tablet   Other Relevant Orders   Comprehensive metabolic panel with GFR   HIV-1 RNA quant-no reflex-bld   T-helper cell (CD4)- ([facility] clinic only)   AMB REFERRAL TO COMMUNITY SERVICE AGENCY   Healthcare maintenance   Discussed importance of safe sexual practice and condom use. Condoms and site specific STD testing offered.  Vaccinations reviewed and declined following counseling Due for routine dental care with referral placed to [facility].  Mammogram ordered.  Completed cervical cancer screening and will check for result date.       Tobacco use   Ms. Quattrone continues to smoke tobacco at 0.5 pack per day. Interested in quitting and following discussion resources provided in AVS and will start buproprion 150 mg daily for 3 days and increase to twice daily therafter for 12 weeks. Will stop smoking about 1 week after starting medication.       Other Visit Diagnoses       Encounter for screening mammogram for malignant neoplasm of  breast       Relevant Orders   MM Digital Screening     Pharmacologic therapy       Relevant Orders   Lipid panel        I have changed Evalene HERO. Habermann's Dovato . I am also having her start on buPROPion . Additionally, I am having her maintain her levocetirizine, albuterol, and hydrOXYzine .   Meds ordered this encounter  Medications   dolutegravir-lamiVUDine (DOVATO ) 50-300 MG tablet    Sig: Take 1 tablet by mouth daily.    Dispense:  30 tablet    Refill:  5    Supervising Provider:   SNIDER, CYNTHIA [phone]    Prescription Type::   Renewal   buPROPion  (WELLBUTRIN  SR) 150 MG 12 hr tablet    Sig: Take 1 tablet by mouth daily for 3 days then 1 tablet by mouth twice daily for 12 weeks.    Dispense:  60 tablet    Refill:  1    Supervising Provider:   LUIZ CHANNEL [[id number]]     Follow-up: Return in about 4 months (around 09/04/2024). or sooner if needed.    Cathlyn July, MSN, FNP-C Nurse Practitioner [facility] for Infectious Disease [facility] Main number: [phone]

## 2024-05-05 NOTE — Patient Instructions (Addendum)
 Nice to see you.  We will check your lab work today.  Continue to take your medication daily as prescribed.  Please call [facility]) to schedule/follow up on your dental care at [phone]  Refills have been sent to the pharmacy.  Start the buproprion 1 week prior to quit date.   Plan for follow up in 2 months or sooner if needed with lab work on the same day.  Have a great day and stay safe!  Smoking Cessation: QuitlineNC 1-800-QUIT-NOW 7174493611); Espaol: 1-855-Djelo-Ya ([phone]) [email or web address]

## 2024-05-05 NOTE — Assessment & Plan Note (Signed)
 Ashley Barber continues to smoke tobacco at 0.5 pack per day. Interested in quitting and following discussion resources provided in AVS and will start buproprion 150 mg daily for 3 days and increase to twice daily therafter for 12 weeks. Will stop smoking about 1 week after starting medication.

## 2024-05-05 NOTE — Assessment & Plan Note (Signed)
 Discussed importance of safe sexual practice and condom use. Condoms and site specific STD testing offered.  Vaccinations reviewed and declined following counseling Due for routine dental care with referral placed to [facility].  Mammogram ordered.  Completed cervical cancer screening and will check for result date.

## 2024-05-05 NOTE — Assessment & Plan Note (Signed)
 Appears to have adequately controlled virus with good adherence and tolerance to Dovato .  Reviewed previous lab work and discussed plan of care and U equals U.  No problems obtaining medication from the pharmacy and covered by Medicaid.  Social determinants of health reviewed with no interventions indicated.  Check blood work.  Continue current dose of Dovato .  Interested in [place] and will see if we can logistically make this work Plan for follow-up in 2 months or sooner if needed.

## 2024-05-07 LAB — COMPREHENSIVE METABOLIC PANEL WITH GFR
AG Ratio: 1.1 (calc) (ref 1.0–2.5)
ALT: 12 U/L (ref 6–29)
AST: 28 U/L (ref 10–30)
Albumin: 4.3 g/dL (ref 3.6–5.1)
Alkaline phosphatase (APISO): 75 U/L (ref 31–125)
BUN/Creatinine Ratio: 7 (calc) (ref 6–22)
BUN: 4 mg/dL — ABNORMAL LOW (ref 7–25)
CO2: 27 mmol/L (ref 20–32)
Calcium: 9.2 mg/dL (ref 8.6–10.2)
Chloride: 106 mmol/L (ref 98–110)
Creat: 0.6 mg/dL (ref 0.50–0.99)
Globulin: 3.9 g/dL — ABNORMAL HIGH (ref 1.9–3.7)
Glucose, Bld: 79 mg/dL (ref 65–99)
Potassium: 4.2 mmol/L (ref 3.5–5.3)
Sodium: 142 mmol/L (ref 135–146)
Total Bilirubin: 0.3 mg/dL (ref 0.2–1.2)
Total Protein: 8.2 g/dL — ABNORMAL HIGH (ref 6.1–8.1)
eGFR: 115 mL/min/1.73m2 (ref >=60)

## 2024-05-07 LAB — T-HELPER CELL (CD4) - (RCID CLINIC ONLY)
CD4 % Helper T Cell: 18 % — ABNORMAL LOW (ref 33–65)
CD4 T Cell Abs: 185 /uL — ABNORMAL LOW (ref 400–1790)

## 2024-05-07 LAB — HIV-1 RNA QUANT-NO REFLEX-BLD
HIV 1 RNA Quant: NOT DETECTED {copies}/mL
HIV-1 RNA Quant, Log: NOT DETECTED {Log_copies}/mL

## 2024-05-07 LAB — LIPID PANEL
Cholesterol: 238 mg/dL — ABNORMAL HIGH (ref <200)
HDL: 60 mg/dL (ref >=50)
LDL Cholesterol (Calc): 140 mg/dL — ABNORMAL HIGH
Non-HDL Cholesterol (Calc): 178 mg/dL — ABNORMAL HIGH (ref <130)
Total CHOL/HDL Ratio: 4 (calc) (ref <5.0)
Triglycerides: 238 mg/dL — ABNORMAL HIGH (ref <150)

## 2024-05-08 ENCOUNTER — Ambulatory Visit: Payer: Self-pay | Admitting: Family

## 2024-05-13 ENCOUNTER — Ambulatory Visit

## 2024-07-23 ENCOUNTER — Ambulatory Visit: Admitting: Family Medicine

## 2024-07-23 ENCOUNTER — Encounter: Payer: Self-pay | Admitting: Family Medicine

## 2024-07-23 VITALS — BP 149/95 | HR 89 | Ht 63.5 in | Wt 133.6 lb

## 2024-07-23 DIAGNOSIS — Z309 Encounter for contraceptive management, unspecified: Secondary | ICD-10-CM

## 2024-07-23 DIAGNOSIS — K219 Gastro-esophageal reflux disease without esophagitis: Secondary | ICD-10-CM | POA: Diagnosis not present

## 2024-07-23 DIAGNOSIS — L2989 Other pruritus: Secondary | ICD-10-CM

## 2024-07-23 DIAGNOSIS — R030 Elevated blood-pressure reading, without diagnosis of hypertension: Secondary | ICD-10-CM

## 2024-07-23 DIAGNOSIS — F419 Anxiety disorder, unspecified: Secondary | ICD-10-CM | POA: Diagnosis not present

## 2024-07-23 LAB — POCT URINE PREGNANCY: Preg Test, Ur: NEGATIVE

## 2024-07-23 MED ORDER — PANTOPRAZOLE SODIUM 40 MG PO TBEC
40.0000 mg | DELAYED_RELEASE_TABLET | Freq: Every day | ORAL | 3 refills | Status: AC
Start: 2024-07-23 — End: ?

## 2024-07-23 MED ORDER — MEDROXYPROGESTERONE ACETATE 150 MG/ML IM SUSP
150.0000 mg | Freq: Once | INTRAMUSCULAR | Status: AC
Start: 2024-07-23 — End: ?

## 2024-07-23 MED ORDER — MEDROXYPROGESTERONE ACETATE 150 MG/ML IM SUSP
150.0000 mg | Freq: Once | INTRAMUSCULAR | Status: AC
Start: 2024-07-23 — End: 2024-07-23
  Administered 2024-07-23: 150 mg via INTRAMUSCULAR

## 2024-07-23 MED ORDER — ALBUTEROL SULFATE HFA 108 (90 BASE) MCG/ACT IN AERS: 1.0000 | INHALATION_SPRAY | Freq: Four times a day (QID) | RESPIRATORY_TRACT | 1 refills | Status: AC | PRN

## 2024-07-23 MED ORDER — ESCITALOPRAM OXALATE 10 MG PO TABS
10.0000 mg | ORAL_TABLET | Freq: Every day | ORAL | 1 refills | Status: AC
Start: 2024-07-23 — End: ?

## 2024-07-23 MED ORDER — HYDROXYZINE PAMOATE 50 MG PO CAPS
50.0000 mg | ORAL_CAPSULE | Freq: Three times a day (TID) | ORAL | 0 refills | Status: AC | PRN
Start: 2024-07-23 — End: ?

## 2024-07-23 NOTE — Progress Notes (Signed)
   SUBJECTIVE:   CHIEF COMPLAINT / HPI:  Discussed the use of AI scribe software for clinical note transcription with the patient, who gave verbal consent to proceed.  History of Present Illness Ashley Barber is a 43 year old female who presents with anxiety exacerbation and medication management.  Anxiety symptoms and psychosocial stressors - Increased anxiety following her mother-in-law's death and ongoing stress related to a court case - Lacks motivation and energy - Identifies bridges as a specific anxiety trigger - Previously treated with Xanax and another medication in 2017, discontinued to avoid long-term use - Not currently taking any medication for anxiety  Chest discomfort - Heart fluttering/chest pain episodes over the past month and a half - Episodes last approximately five minutes - Symptoms are triggered by consumption of grape soda, grape Kool-Aid, and red Kool-Aid, with greater severity after Kool-Aid compared to soda - Not at rest or with exertion  Medication management - Takes Dovato  daily for HIV - Requires refill for albuterol  inhaler - Needs another depo-provera  injection   OBJECTIVE:  BP (!) 149/95   Pulse 89   Ht 5' 3.5 (1.613 m)   Wt 133 lb 9.6 oz (60.6 kg)   LMP 11/26/2022   SpO2 100%   BMI 23.29 kg/m   Physical Exam GENERAL: Alert, cooperative, well developed, no acute distress CHEST: Clear to auscultation bilaterally, No wheezes, rhonchi, or crackles CARDIOVASCULAR: Normal heart rate and rhythm, S1 and S2 normal without murmurs EXTREMITIES: No cyanosis or edema NEUROLOGICAL: Cranial nerves grossly intact, Moves all extremities without gross motor or sensory deficit  ASSESSMENT/PLAN:   Assessment & Plan Encounter for contraceptive management, unspecified type She is due for her Depo-Provera  injection. Upreg negative. Last sexual encounter was 2 weeks ago, though this was protected. Chance of pregnancy low. - Administered Depo-Provera   injection today. Anxious mood Exacerbated by recent stressors. No SI. Lexapro  recommended for daily management, hydroxyzine  for acute episodes. Lexapro  may take 4-6 weeks for effect. Hydroxyzine  less addictive than Xanax, caution with sedative effects. - Prescribed Lexapro  10 mg daily for anxiety, STOP wellbutrin   - Re-prescribed hydroxyzine  50 mg TID as needed for acute anxiety episodes. - Provided therapy resources for anxiety management. Gastroesophageal reflux disease, unspecified whether esophagitis present Intermittent chest discomfort, only postprandial and with food additives and spaghetti sauce, suggestive of ?GERD. Protonix  recommended to reduce stomach acid. - Prescribed Protonix  for GERD management. - Advised to report if symptoms worsen before the next appointment in 1 month Elevated BP without diagnosis of hypertension More elevated today than at prior visits, questionably due to increased anxiety. She is comfortable on exam. Recheck in 1 month with anxiety treatment as above.  Stuart Redo, MD [facility]

## 2024-07-23 NOTE — Patient Instructions (Addendum)
 I have sent in protonix  for the chest discomfort after drinking/eating spaghetti, but let me know if this is worsening before you come back in 1 month.  I have also sent in lexapro  to help with anxiety. I have sent in hydroxyzine  with this to take as needed for severe anxiety. Be sure to come back in 1 month to check in or sooner if this is worsening before then.  You received your next dose of depo-provera  today.   Therapy and Counseling Resources Most providers on this list will take Medicaid. Patients with commercial insurance or Medicare should contact their insurance company to get a list of in network providers.  [facility] (takes children) Location 1: [address], [address] [place], [place] [address] Location 2: [address] [place], [place] [address] [phone]   [facility] (spanish speaking therapist available)(habla espanol)(take medicare and medicaid)  [address], [place], [place] [address], [place]  [email or web address] [phone]  [facility] Counseling [address] [place], [place] [address] [phone]  [facility]   [address] [place], [place] [address]      [phone]  [facility] (spanish available) [address]  [place], [place] [address] [phone]  [facility] (take [facility] and medicare) [address]  [place], [place] [address]       [phone]     [facility] (virtual only) [phone]  [facility] [address], [place], [place] [phone]  [facility]:  [address] [place] [phone]  [facility]:  [address] [place] [phone]  [facility] (under & uninsured) [address]   [place] [phone]    [email or web address]    [facility] [address]  [place]     [phone]  [facility] -[address]     Phone:  [phone]     [facility]-  [address]  [phone]   [facility] [address]      [place], [place] [phone]  [facility]: [address] [place], [place], [place]  [phone]   [facility] (Spanish therapist) [email or web address]  [address] [place]  [address]   [place] [address]    [phone]    [facility]   [address],  [place], [place]  [phone]   [facility]  [address] [place]  [phone]  [facility]  [address] [place], [place]        [phone]  Open Access/Walk In Clinic under & uninsured  [facility]  [address] [place], [place] [phone] Crisis [phone]  [facility] [address],  (Spanish)   [address] , [place]: [phone]) 8:30 - 12; 1 - 2:30  [facility] [employer],  [address] [place], [place]    [phone]):8:30 - 12; 2 - 3PM  [facility] [employer],  [address]  [place]; [phone]):   Mon - Fri 8 AM - 5 PM  [facility] [address] [place]  MWF 12:30 to 3:00 or call to schedule an appointment  [phone]  Specific Provider options Psychology Today  [email or web address]/us  click on find a therapist  enter your zip code left side and select  or tailor a therapist for your specific need.   [facility] Provider Directory [email or web address]  (Medicaid)   Follow all drop down to find a provider  Social Support program [facility] [place] [phone] or photosolver.pl [address], [place], [place] Recovery support and educational   24- Hour Availability:   [facility]  [address] [place], [place]  [phone] Crisis [phone]  [facility] [phone]  [place] [facility]  [phone]   [facility]  [phone] (after hours)  Therapeutic Alternative/Mobile Crisis   [phone]  [place]  National Suicide Hotline  878 508 7620 MERRILYN)  Call 911 or go to emergency room  [facility]  [phone]);  [place]  [phone]); [place], [place], [place], [place], [place], [place], [place]

## 2024-10-26 ENCOUNTER — Ambulatory Visit: Admitting: Family
